# Patient Record
Sex: Male | Born: 2000 | Race: Black or African American | Hispanic: No | Marital: Single | Smoking: Never smoker
Health system: Southern US, Community
[De-identification: ages and names within clinical notes are randomized; demographics above are authoritative.]

## PROBLEM LIST (undated history)

## (undated) DIAGNOSIS — F1010 Alcohol abuse, uncomplicated: Secondary | ICD-10-CM

## (undated) DIAGNOSIS — R569 Unspecified convulsions: Secondary | ICD-10-CM

---
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
## 2019-06-10 ENCOUNTER — Emergency Department (HOSPITAL_COMMUNITY)
Admission: EM | Admit: 2019-06-10 | Discharge: 2019-06-11 | Disposition: A | Discharge status: Home / Self Care | Payer: Self-pay | Attending: Emergency Medicine | Admitting: Emergency Medicine

## 2019-06-10 ENCOUNTER — Emergency Department (HOSPITAL_COMMUNITY): Payer: Self-pay

## 2019-06-10 ENCOUNTER — Other Ambulatory Visit: Payer: Self-pay

## 2019-06-10 DIAGNOSIS — J029 Acute pharyngitis, unspecified: Secondary | ICD-10-CM | POA: Insufficient documentation

## 2019-06-10 DIAGNOSIS — R519 Headache, unspecified: Secondary | ICD-10-CM | POA: Insufficient documentation

## 2019-06-10 DIAGNOSIS — R05 Cough: Secondary | ICD-10-CM | POA: Insufficient documentation

## 2019-06-10 DIAGNOSIS — R112 Nausea with vomiting, unspecified: Secondary | ICD-10-CM | POA: Insufficient documentation

## 2019-06-10 DIAGNOSIS — R059 Cough, unspecified: Secondary | ICD-10-CM

## 2019-06-10 DIAGNOSIS — Z20822 Contact with and (suspected) exposure to covid-19: Secondary | ICD-10-CM | POA: Insufficient documentation

## 2019-06-10 DIAGNOSIS — R569 Unspecified convulsions: Secondary | ICD-10-CM | POA: Insufficient documentation

## 2019-06-10 HISTORY — DX: Unspecified convulsions: R56.9

## 2019-06-10 LAB — CBC WITH DIFFERENTIAL/PLATELET
Abs Immature Granulocytes: 0.08 10*3/uL — ABNORMAL HIGH (ref 0.00–0.07)
Basophils Absolute: 0 10*3/uL (ref 0.0–0.1)
Basophils Relative: 0 %
Eosinophils Absolute: 0 10*3/uL (ref 0.0–0.5)
Eosinophils Relative: 0 %
HCT: 46.1 % (ref 39.0–52.0)
Hemoglobin: 16.3 g/dL (ref 13.0–17.0)
Immature Granulocytes: 1 %
Lymphocytes Relative: 13 %
Lymphs Abs: 2.1 10*3/uL (ref 0.7–4.0)
MCH: 32.3 pg (ref 26.0–34.0)
MCHC: 35.4 g/dL (ref 30.0–36.0)
MCV: 91.3 fL (ref 80.0–100.0)
Monocytes Absolute: 1.3 10*3/uL — ABNORMAL HIGH (ref 0.1–1.0)
Monocytes Relative: 8 %
Neutro Abs: 12.2 10*3/uL — ABNORMAL HIGH (ref 1.7–7.7)
Neutrophils Relative %: 78 %
Platelets: 244 10*3/uL (ref 150–400)
RBC: 5.05 MIL/uL (ref 4.22–5.81)
RDW: 11.8 % (ref 11.5–15.5)
WBC: 15.7 10*3/uL — ABNORMAL HIGH (ref 4.0–10.5)
nRBC: 0 % (ref 0.0–0.2)

## 2019-06-10 LAB — COMPREHENSIVE METABOLIC PANEL
ALT: 31 U/L (ref 0–44)
AST: 58 U/L — ABNORMAL HIGH (ref 15–41)
Albumin: 4.8 g/dL (ref 3.5–5.0)
Alkaline Phosphatase: 127 U/L — ABNORMAL HIGH (ref 38–126)
Anion gap: 27 — ABNORMAL HIGH (ref 5–15)
BUN: 12 mg/dL (ref 6–20)
CO2: 15 mmol/L — ABNORMAL LOW (ref 22–32)
Calcium: 10.4 mg/dL — ABNORMAL HIGH (ref 8.9–10.3)
Chloride: 97 mmol/L — ABNORMAL LOW (ref 98–111)
Creatinine, Ser: 0.87 mg/dL (ref 0.61–1.24)
GFR calc Af Amer: >60 mL/min (ref >60)
GFR calc non Af Amer: >60 mL/min (ref >60)
Glucose, Bld: 151 mg/dL — ABNORMAL HIGH (ref 70–99)
Potassium: 3.6 mmol/L (ref 3.5–5.1)
Sodium: 139 mmol/L (ref 135–145)
Total Bilirubin: 1.7 mg/dL — ABNORMAL HIGH (ref 0.3–1.2)
Total Protein: 9.5 g/dL — ABNORMAL HIGH (ref 6.5–8.1)

## 2019-06-10 LAB — POC SARS CORONAVIRUS 2 AG -  ED: SARS Coronavirus 2 Ag: NEGATIVE

## 2019-06-10 LAB — ETHANOL: Alcohol, Ethyl (B): <10 mg/dL (ref <10)

## 2019-06-10 LAB — LIPASE, BLOOD: Lipase: 21 U/L (ref 11–51)

## 2019-06-10 LAB — CBG MONITORING, ED: Glucose-Capillary: 134 mg/dL — ABNORMAL HIGH (ref 70–99)

## 2019-06-10 LAB — ACETAMINOPHEN LEVEL: Acetaminophen (Tylenol), Serum: <10 ug/mL — ABNORMAL LOW (ref 10–30)

## 2019-06-10 LAB — MAGNESIUM: Magnesium: 2.5 mg/dL — ABNORMAL HIGH (ref 1.7–2.4)

## 2019-06-10 LAB — SALICYLATE LEVEL: Salicylate Lvl: <7 mg/dL — ABNORMAL LOW (ref 7.0–30.0)

## 2019-06-10 MED ORDER — KETOROLAC TROMETHAMINE 15 MG/ML IJ SOLN
15.0000 mg | Freq: Once | INTRAMUSCULAR | Status: DC
  Filled 2019-06-10: qty 1

## 2019-06-10 MED ORDER — BENZONATATE 100 MG PO CAPS: 100.0000 mg | ORAL_CAPSULE | Freq: Three times a day (TID) | ORAL | 0 refills | Status: DC

## 2019-06-10 MED ORDER — LEVETIRACETAM IN NACL 1500 MG/100ML IV SOLN
1500.0000 mg | Freq: Once | INTRAVENOUS | Status: AC
  Administered 2019-06-10: 1500 mg via INTRAVENOUS
  Filled 2019-06-10: qty 100

## 2019-06-10 MED ORDER — LEVETIRACETAM 500 MG PO TABS: 500.0000 mg | ORAL_TABLET | Freq: Two times a day (BID) | ORAL | 0 refills | Status: DC

## 2019-06-10 MED ORDER — MIDAZOLAM HCL 2 MG/2ML IJ SOLN
INTRAMUSCULAR | Status: AC
  Administered 2019-06-10: 21:00:00 2 mg
  Filled 2019-06-10: qty 2

## 2019-06-10 MED ORDER — FLUTICASONE PROPIONATE 50 MCG/ACT NA SUSP: 1.0000 | Freq: Every day | NASAL | 0 refills | Status: DC

## 2019-06-10 MED ORDER — SODIUM CHLORIDE 0.9 % IV BOLUS
1000.0000 mL | Freq: Once | INTRAVENOUS | Status: AC
  Administered 2019-06-10: 21:00:00 1000 mL via INTRAVENOUS

## 2019-06-10 MED ORDER — SODIUM CHLORIDE 0.9 % IV BOLUS
1000.0000 mL | Freq: Once | INTRAVENOUS | Status: AC
  Administered 2019-06-10: 1000 mL via INTRAVENOUS

## 2019-06-10 MED ORDER — ONDANSETRON HCL 4 MG/2ML IJ SOLN
4.0000 mg | Freq: Once | INTRAMUSCULAR | Status: AC
  Administered 2019-06-10: 21:00:00 4 mg via INTRAVENOUS
  Filled 2019-06-10: qty 2

## 2019-06-10 MED ORDER — ONDANSETRON HCL 4 MG PO TABS: 4.0000 mg | ORAL_TABLET | Freq: Three times a day (TID) | ORAL | 0 refills | Status: DC | PRN

## 2019-06-10 NOTE — ED Triage Notes (Signed)
[facility] transported pt from home to [facility] ED and reports the following:   Pt said he has headaches, runny nose, sore throat, feels like vomiting every time he eats. This has been going on for 2 days. Pt hasn't taken any medication. Denies chest pain, SOB, and temperature.

## 2019-06-10 NOTE — ED Provider Notes (Signed)
Patient care signed out at end of shift by Phia Caccavale, PA-C. Presents with COVID-like symptoms Had seizure here. 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 No recurrent seizure in the department. VSS. He is felt appropriate for discharge per plan of previous treatment team.     Elpidio Anis, PA-C 06/11/19 0141    Terald Sleeper, MD 06/11/19 1218

## 2019-06-10 NOTE — ED Provider Notes (Signed)
[facility]-EMERGENCY DEPT Provider Note   CSN: [id number] Arrival date & time: 06/10/19  1924     History Chief Complaint  Patient presents with  . Headache    Julyan Gales is a 19 y.o. male presetning for evaluation of HA, runny nose, cough, n/v.   Pt states he has not been feeling well for 2 days. He states he has been having a generalized headache, runny nose, occasional epistaxis, cough producing green mucus, nausea, vomiting.  He has not been able to tolerate solid foods for the past 2 days.  He has been able to drink water.  He has associated sore throat.  He has not taken anything for his symptoms including Tylenol or ibuprofen.  He denies fevers, chills, ear pain, chest pain, shortness of breath, abdominal pain, urinary symptoms, abnormal bowel movements.  He states he has no medical problems, takes no medications daily.  He denies sick contacts.  He denies contact with known COVID-19 positive person.  History obtained with interpreter service, as pt speaks Swahili.   HPI     No past medical history on file.  There are no problems to display for this patient.   No past surgical history on file.     No family history on file.  Social History   Tobacco Use  . Smoking status: Never Smoker  . Smokeless tobacco: Never Used  Substance Use Topics  . Alcohol use: Never  . Drug use: Never    Home Medications Prior to Admission medications   Medication Sig Start Date End Date Taking? Authorizing Provider  amoxicillin (AMOXIL) 500 MG capsule Take 2 capsules (1,000 mg total) by mouth 2 (two) times daily. 07/02/17   [facility], Chase Picket, PA-C  azithromycin (ZITHROMAX) 250 MG tablet Take 1 tablet (250 mg total) by mouth daily. Take first 2 tablets together, then 1 every day until finished. 07/02/17   [facility], Chase Picket, PA-C  ibuprofen (ADVIL,MOTRIN) 800 MG tablet Take 1 tablet (800 mg total) by mouth every 8 (eight) hours as needed for fever or moderate  pain. 07/02/17   [facility], Chase Picket, PA-C    Allergies    Patient has no known allergies.  Review of Systems   Review of Systems  HENT: Positive for sore throat.   Respiratory: Positive for cough.   Gastrointestinal: Positive for nausea and vomiting.  Neurological: Positive for headaches.  All other systems reviewed and are negative.   Physical Exam Updated Vital Signs BP (!) 145/84   Pulse (!) 107   Temp 98.3 F (36.8 C) (Oral)   Resp 19   SpO2 96%   Physical Exam Vitals and nursing note reviewed.  Constitutional:      General: He is not in acute distress.    Appearance: He is well-developed.     Comments: Appears dehydrated, otherwise nontoxic  HENT:     Head: Normocephalic and atraumatic.     Mouth/Throat:     Mouth: Mucous membranes are dry.  Eyes:     Conjunctiva/sclera: Conjunctivae normal.     Pupils: Pupils are equal, round, and reactive to light.  Cardiovascular:     Rate and Rhythm: Normal rate and regular rhythm.     Pulses: Normal pulses.     Comments: Heart rate around 100 on my exam Pulmonary:     Effort: Pulmonary effort is normal. No respiratory distress.     Breath sounds: Normal breath sounds. No wheezing.     Comments: Clear lung sounds in  all fields.  Intermittent cough noted during exam.  No signs of respiratory distress.  Sats stable on room air. Abdominal:     General: There is no distension.     Palpations: Abdomen is soft. There is no mass.     Tenderness: There is no abdominal tenderness. There is no guarding or rebound.     Comments: No tenderness to palpation the abdomen.  Soft without rigidity, guarding, distention.  Negative rebound.  No peritonitis.  Musculoskeletal:        General: Normal range of motion.     Cervical back: Normal range of motion and neck supple.     Right lower leg: No edema.     Left lower leg: No edema.  Skin:    General: Skin is warm and dry.     Capillary Refill: Capillary refill takes less than 2 seconds.    Neurological:     Mental Status: He is alert and oriented to person, place, and time.  Psychiatric:     Comments: Pt not making eye contact     ED Results / Procedures / Treatments   Labs (all labs ordered are listed, but only abnormal results are displayed) Labs Reviewed  CBC WITH DIFFERENTIAL/PLATELET - Abnormal; Notable for the following components:      Result Value   WBC 15.7 (*)    Neutro Abs 12.2 (*)    Monocytes Absolute 1.3 (*)    Abs Immature Granulocytes 0.08 (*)    All other components within normal limits  CBG MONITORING, ED - Abnormal; Notable for the following components:   Glucose-Capillary 134 (*)    All other components within normal limits  SARS CORONAVIRUS 2 (TAT 6-24 HRS)  COMPREHENSIVE METABOLIC PANEL  LIPASE, BLOOD  RAPID URINE DRUG SCREEN, HOSP PERFORMED  URINALYSIS, ROUTINE W REFLEX MICROSCOPIC  ETHANOL  SALICYLATE LEVEL  ACETAMINOPHEN LEVEL  MAGNESIUM    EKG None  Radiology DG Chest Portable 1 View  Result Date: 06/10/2019 CLINICAL DATA:  Cough. Sore throat and headache. EXAM: PORTABLE CHEST 1 VIEW COMPARISON:  Radiograph 07/02/2017 FINDINGS: The cardiomediastinal contours are normal. The lungs are clear. Pulmonary vasculature is normal. No consolidation, pleural effusion, or pneumothorax. No acute osseous abnormalities are seen. IMPRESSION: Negative AP view of the chest. Electronically Signed   By: Narda Rutherford M.D.   On: 06/10/2019 20:03    Procedures Procedures (including critical care time)  Medications Ordered in ED Medications  ketorolac (TORADOL) 15 MG/ML injection 15 mg (15 mg Intravenous Not Given 06/10/19 2103)  sodium chloride 0.9 % bolus 1,000 mL (1,000 mLs Intravenous New Bag/Given 06/10/19 2102)  ondansetron (ZOFRAN) injection 4 mg (4 mg Intravenous Given 06/10/19 2103)  midazolam (VERSED) 2 MG/2ML injection (2 mg  Given 06/10/19 2030)    ED Course  I have reviewed the triage vital signs and the nursing notes.  Pertinent  labs & imaging results that were available during my care of the patient were reviewed by me and considered in my medical decision making (see chart for details).    MDM Rules/Calculators/A&P                      Pt presenting for evaluation of viral-like illness with symptoms including headache, sore throat, nausea, vomiting, cough.  Consider Covid.  Respiratory exam is reassuring.  He is mildly tachycardic, and appears dehydrated.  As such, will obtain labs to assess for dehydration, assess kidney function, ensure no concerning signs or intra-abdominal abnormality although  patient has no pain.  Will obtain x-ray to ensure no pneumonia.  Informed by RN that patient had a seizure.  I evaluated patient, patient with tonic-clonic movement and clear postictal phase.  He was found on the floor, not in the bed, unknown if he fell.  5 mg of Versed was given.  Patient had epistaxis, which he had reported over the past several days.  Unknown if this is from the fall.  Will obtain further labs including electrolytes, UDS, Tylenol and acetaminophen levels.  Will obtain UDS.  Will obtain CT head, neck, maxillofacial.  Patient has some swelling of his right zygoma, and with epistaxis, concern for possible facial injury. Case discussed with attending, Dr. Langston Masker evaluated the pt.   Patient more awake on my reassessment.  When asked, patient states he has had seizures before, last one was 2 months ago.  He states he is not on any medicine for this, has never been evaluated for seizures.  Labs interpreted by me.  Patient is acidotic with a bicarb of 15 and gap of 27.  This is likely due to the seizure, compounded by dehydration from nausea and vomiting.  White count mildly elevated at 15.  Patient remains afebrile.  Giving fluids for rehydration, Zofran for nausea.  Chest x-ray viewed interpreted by me, no pneumonia, torso effusion, cardiomegaly.  No fracture or dislocation.  EKG shows sinus tach.  CT c-spine, and  maxillofacial negative for acute fracture or bleed.  Patient does have sinusitis.  CT head does not show any fracture or bleed.  There is mild cerebellar tonsillar depression which may be normal or may be a Chiari I malformation.  As patient reports multiple previous seizures, and had a witnessed seizure today with an abnormal head CT, will consult with neurology.  Discussed with Dr. Malen Gauze from neurology who recommends 1500 mg Keppra loading dose in the ED.  Recommends patient be discharged on 500 mg of Keppra twice daily with outpatient neurology follow-up.  Agrees that MRI can be performed nonemergent/outpatient.  Discussed findings and plan with pt, who is agreeable. Discussed CT results and importance of neuro f/u outpatient. Discussed seizure precautions. Pt is tolerating PO at this time, nausea is improved.   Pt signed out to Sharlene Motts, PA-C for f/u on repeat BMP.   Final Clinical Impression(s) / ED Diagnoses Final diagnoses:  None    Rx / DC Orders ED Discharge Orders    None       Lezly Rumpf, PA-C 06/11/19 0000    Wyvonnia Dusky, MD 06/11/19 1220

## 2019-06-10 NOTE — Discharge Instructions (Signed)
Take Keppra twice a day. Use Zofran as needed for nausea or vomiting. Use Tessalon as needed for cough. Use Flonase daily to help with nasal congestion and cough. Use Tylenol and ibuprofen as needed for headache or body aches. Make sure stay well-hydrated water. Call the neurology office listed below to follow-up for further evaluation of seizures.  It is very important that you follow-up with them so you can keep on getting your medication. Do not drive, swim, operate heavy machinery, or perform any tasks that may harm yourself or others if you have another seizure. Return to the emergency room if any new, worsening, concerning symptoms.

## 2019-06-10 NOTE — ED Notes (Signed)
Pt speaks Swahili. Translation iPad is not working. Pt reports sore throat, blood in his nose, headaches, cough and vomits when he eats. Started 2 days ago.

## 2019-06-11 ENCOUNTER — Encounter (HOSPITAL_COMMUNITY): Payer: Self-pay | Admitting: Emergency Medicine

## 2019-06-11 LAB — BASIC METABOLIC PANEL
Anion gap: 10 (ref 5–15)
BUN: 9 mg/dL (ref 6–20)
CO2: 25 mmol/L (ref 22–32)
Calcium: 8.2 mg/dL — ABNORMAL LOW (ref 8.9–10.3)
Chloride: 102 mmol/L (ref 98–111)
Creatinine, Ser: 0.74 mg/dL (ref 0.61–1.24)
GFR calc Af Amer: >60 mL/min (ref >60)
GFR calc non Af Amer: >60 mL/min (ref >60)
Glucose, Bld: 106 mg/dL — ABNORMAL HIGH (ref 70–99)
Potassium: 4.2 mmol/L (ref 3.5–5.1)
Sodium: 137 mmol/L (ref 135–145)

## 2019-06-11 LAB — URINALYSIS, ROUTINE W REFLEX MICROSCOPIC
Bacteria, UA: NONE SEEN
Bilirubin Urine: NEGATIVE
Glucose, UA: NEGATIVE mg/dL
Ketones, ur: 20 mg/dL — AB
Leukocytes,Ua: NEGATIVE
Nitrite: NEGATIVE
Protein, ur: NEGATIVE mg/dL
Specific Gravity, Urine: 1.014 (ref 1.005–1.030)
pH: 6 (ref 5.0–8.0)

## 2019-06-11 LAB — RAPID URINE DRUG SCREEN, HOSP PERFORMED
Amphetamines: NOT DETECTED
Barbiturates: NOT DETECTED
Benzodiazepines: POSITIVE — AB
Cocaine: NOT DETECTED
Opiates: NOT DETECTED
Tetrahydrocannabinol: NOT DETECTED

## 2019-06-11 LAB — SARS CORONAVIRUS 2 (TAT 6-24 HRS): SARS Coronavirus 2: NEGATIVE

## 2019-06-11 NOTE — ED Notes (Addendum)
While connected to the BP cuff and O2 sat, pt stepped out of the room and pointed at his foot. I asked him to return to his room because he was on COVID precautions. His expression was blank, he did not speak, and continued to point at this foot. I asked him repeatedly to step back in the room and close the door. He did not respond. I guided him back in the room and shut the door. I walked to the providers' room and let the provider know the pt was acting abnormally. I walked back to the pt's room. The xray technician had just entered the room and said the pt was having a seizure. Upon entering the room, the pt was lying on the floor having a seizure. It lasted around 30 seconds. Immeditaley, I let the provider know.

## 2019-06-11 NOTE — ED Notes (Signed)
Used provider phone to call interpreter service. Spoke with interpreter [phone]. Explained discharge instructions to pt. Stressed the importance of following up with neurology and taking Keppra. Pt said he is unable to afford all the medications. I told him he had to take Keppra for seizures. I gave him a GoodRx coupon and wrote down prices of Keppra at [employer] [facility] [employer]. The pt said he does not have a ride home. The charge nurse unsuccessully attempted to contact his family, so she called the [facility] for a taxi voucher. Waiting to hear back on taxi voucher.

## 2019-11-02 ENCOUNTER — Emergency Department (HOSPITAL_COMMUNITY): Payer: Self-pay

## 2019-11-02 ENCOUNTER — Emergency Department (HOSPITAL_COMMUNITY)
Admission: EM | Admit: 2019-11-02 | Discharge: 2019-11-02 | Disposition: A | Discharge status: Home / Self Care | Payer: Self-pay | Attending: Emergency Medicine | Admitting: Emergency Medicine

## 2019-11-02 ENCOUNTER — Encounter (HOSPITAL_COMMUNITY): Payer: Self-pay | Admitting: Emergency Medicine

## 2019-11-02 ENCOUNTER — Other Ambulatory Visit: Payer: Self-pay

## 2019-11-02 DIAGNOSIS — J3489 Other specified disorders of nose and nasal sinuses: Secondary | ICD-10-CM | POA: Insufficient documentation

## 2019-11-02 DIAGNOSIS — Z20822 Contact with and (suspected) exposure to covid-19: Secondary | ICD-10-CM | POA: Insufficient documentation

## 2019-11-02 DIAGNOSIS — Z5900 Homelessness unspecified: Secondary | ICD-10-CM

## 2019-11-02 DIAGNOSIS — J019 Acute sinusitis, unspecified: Secondary | ICD-10-CM | POA: Insufficient documentation

## 2019-11-02 LAB — SARS CORONAVIRUS 2 BY RT PCR (HOSPITAL ORDER, PERFORMED IN ~~LOC~~ HOSPITAL LAB): SARS Coronavirus 2: NEGATIVE

## 2019-11-02 MED ORDER — ACETAMINOPHEN 325 MG PO TABS
650.0000 mg | ORAL_TABLET | Freq: Once | ORAL | Status: AC
  Administered 2019-11-02: 650 mg via ORAL
  Filled 2019-11-02: qty 2

## 2019-11-02 MED ORDER — AMOXICILLIN-POT CLAVULANATE 875-125 MG PO TABS
1.0000 | ORAL_TABLET | Freq: Once | ORAL | Status: AC
  Administered 2019-11-02: 1 via ORAL
  Filled 2019-11-02: qty 1

## 2019-11-02 MED ORDER — AMOXICILLIN-POT CLAVULANATE 875-125 MG PO TABS: 1.0000 | ORAL_TABLET | Freq: Two times a day (BID) | ORAL | 0 refills | Status: DC

## 2019-11-02 MED ORDER — FLUTICASONE PROPIONATE 50 MCG/ACT NA SUSP
2.0000 | Freq: Every day | NASAL | Status: DC
  Administered 2019-11-02: 2 via NASAL
  Filled 2019-11-02: qty 16

## 2019-11-02 NOTE — Discharge Instructions (Addendum)
Homeless Resources [place] [employer] [facility] [phone] Call for shelter availability.  You must have access to a phone so they can call you back.  [facility]) [address] [place] [phone] M-F 8:00-3:00; S-S 8:00-2:00  Area Shel[facility] (Men and Women) [address] [phone]  [facility] (Men and Women) [address] [place] [phone]  [facility] [facility] (Domestic Violence Shelter) [address] [place] [phone]  [facility] (Men) [address] [employer] [phone]  [employer] [facility] (Single women and women with children) [address] [place] [phone]     Additionally, due to your symptoms today I have prescribed an antibiotic medication and a nasal spray. Please take the medications as directed.   I have also placed information for a free health clinic in your paperwork so that you can get established with a primary care doctor.  Please call the office to schedule an appointment for follow-up.  If you have any new or worsening symptoms at any time please return to the emergency department for reevaluation -------------------------------------------------------------------------------------------   Rasilimali zisizo na Ny[place]a [place] ya [address] [place] wa Cristina Gong [phone] Piga simu kwa upatikanaji wa Cristina Gong. Lazima uwe na ufikiaji wa simu ili waweze kukupigia tena.  Kituo cha Mchana Kituo cha Rasilimali cha [facility]) Mtaa wa [address] [place] [phone] M-F 8: 00-3: 00; S-8: 00-2: 00  Makao ya eneo [place] za Odem Mjini [place] na [place]) [address] [name] [phone]  Kituo cha Gearldine [place] la [place] la [place] [facility] na [place]) Mtaa wa [address]  mwa [place] [phone]  [facility] ya [place] za [place]) Mtaa wa [address] [place] [phone]  Mawaziri Scharlene Corn wa [place]) Karne ya [address] [place] [phone]  Gearldine [place] la [place] [facility] wasioolewa na [place] walio na watoto) Mtaa wa [address] [place] [phone]     Alice Reichert, kwa sababu ya dalili zako leo nimekuandikia dawa ya antibiotic na dawa ya pua.  Tumia pumzi mbili za dawa ya pua katika kila pua mara moja kwa siku. Tafadhali chukua dawa kama ilivyoelekezwa.  Nimeweka habari pia kwa kliniki ya afya ya bure kwenye makaratasi yako ili uweze kupata nguvu na daktari wa huduma ya msingi. Tafadhali piga simu ofisini kupanga ratiba ya ufuatiliaji. Barnett Hatter dalili mpya au mbaya wakati wowote tafadhali rudi kwa idara ya dharura kwa uchunguzi upya       .

## 2019-11-02 NOTE — Discharge Planning (Signed)
RNCM consulted regarding homelessness resources.  RNCM placed names and numbers of shelters and resources on pt After Visit Summary.  RNCM obtained permission to enter pt in [facility] for additional support.
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
## 2019-11-02 NOTE — ED Notes (Signed)
Patient verbalizes understanding of discharge instructions. Opportunity for questioning and answers were provided. Armband removed by staff, pt discharged from ED ambulatory.   

## 2019-11-02 NOTE — Progress Notes (Signed)
Responded to referral note to support patient and staff. Per nurse patient has cold  Symptom that merit caution until test come back.  Chaplain available as needed.  Venida Jarvis, [place], [facility], Pager [phone]
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
## 2019-11-05 ENCOUNTER — Emergency Department (HOSPITAL_COMMUNITY)
Admission: EM | Admit: 2019-11-05 | Discharge: 2019-11-06 | Disposition: A | Discharge status: Home / Self Care | Payer: Medicaid Other | Attending: Emergency Medicine | Admitting: Emergency Medicine

## 2019-11-05 DIAGNOSIS — Y9389 Activity, other specified: Secondary | ICD-10-CM | POA: Diagnosis not present

## 2019-11-05 DIAGNOSIS — Y998 Other external cause status: Secondary | ICD-10-CM | POA: Diagnosis not present

## 2019-11-05 DIAGNOSIS — Y9289 Other specified places as the place of occurrence of the external cause: Secondary | ICD-10-CM | POA: Diagnosis not present

## 2019-11-05 DIAGNOSIS — S0083XA Contusion of other part of head, initial encounter: Secondary | ICD-10-CM | POA: Insufficient documentation

## 2019-11-05 DIAGNOSIS — T148XXA Other injury of unspecified body region, initial encounter: Secondary | ICD-10-CM

## 2019-11-06 ENCOUNTER — Encounter (HOSPITAL_COMMUNITY): Payer: Self-pay | Admitting: Emergency Medicine

## 2019-11-06 ENCOUNTER — Other Ambulatory Visit: Payer: Self-pay
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
## 2019-12-01 ENCOUNTER — Emergency Department (HOSPITAL_COMMUNITY)
Admission: EM | Admit: 2019-12-01 | Discharge: 2019-12-02 | Disposition: A | Discharge status: Home / Self Care | Payer: Medicaid Other | Attending: Emergency Medicine | Admitting: Emergency Medicine

## 2019-12-01 ENCOUNTER — Other Ambulatory Visit: Payer: Self-pay

## 2019-12-01 ENCOUNTER — Encounter (HOSPITAL_COMMUNITY): Payer: Self-pay | Admitting: *Deleted

## 2019-12-01 DIAGNOSIS — R569 Unspecified convulsions: Secondary | ICD-10-CM

## 2019-12-01 DIAGNOSIS — G40909 Epilepsy, unspecified, not intractable, without status epilepticus: Secondary | ICD-10-CM | POA: Diagnosis present

## 2019-12-01 LAB — BASIC METABOLIC PANEL
Anion gap: 14 (ref 5–15)
BUN: 7 mg/dL (ref 6–20)
CO2: 24 mmol/L (ref 22–32)
Calcium: 9.7 mg/dL (ref 8.9–10.3)
Chloride: 93 mmol/L — ABNORMAL LOW (ref 98–111)
Creatinine, Ser: 0.84 mg/dL (ref 0.61–1.24)
GFR calc Af Amer: >60 mL/min (ref >60)
GFR calc non Af Amer: >60 mL/min (ref >60)
Glucose, Bld: 85 mg/dL (ref 70–99)
Potassium: 4.3 mmol/L (ref 3.5–5.1)
Sodium: 131 mmol/L — ABNORMAL LOW (ref 135–145)

## 2019-12-01 LAB — CBC
HCT: 42.3 % (ref 39.0–52.0)
Hemoglobin: 15.3 g/dL (ref 13.0–17.0)
MCH: 32.1 pg (ref 26.0–34.0)
MCHC: 36.2 g/dL — ABNORMAL HIGH (ref 30.0–36.0)
MCV: 88.9 fL (ref 80.0–100.0)
Platelets: 154 10*3/uL (ref 150–400)
RBC: 4.76 MIL/uL (ref 4.22–5.81)
RDW: 12.2 % (ref 11.5–15.5)
WBC: 6 10*3/uL (ref 4.0–10.5)
nRBC: 0 % (ref 0.0–0.2)
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
## 2019-12-01 NOTE — ED Notes (Addendum)
Sean [name]- casemanager [phone] please call if any information is needed

## 2019-12-02 ENCOUNTER — Emergency Department (HOSPITAL_COMMUNITY): Payer: Medicaid Other

## 2019-12-02 MED ORDER — LEVETIRACETAM 500 MG PO TABS
1000.0000 mg | ORAL_TABLET | Freq: Once | ORAL | Status: AC
  Administered 2019-12-02: 1000 mg via ORAL
  Filled 2019-12-02: qty 2

## 2019-12-02 MED ORDER — LEVETIRACETAM 500 MG PO TABS: 500.0000 mg | ORAL_TABLET | Freq: Two times a day (BID) | ORAL | 0 refills | Status: DC

## 2019-12-02 MED ORDER — ACETAMINOPHEN 500 MG PO TABS
1000.0000 mg | ORAL_TABLET | Freq: Once | ORAL | Status: AC
  Administered 2019-12-02: 1000 mg via ORAL
  Filled 2019-12-02: qty 2
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
## 2019-12-02 NOTE — ED Notes (Signed)
ED Provider at bedside. 

## 2019-12-05 ENCOUNTER — Encounter (HOSPITAL_COMMUNITY): Payer: Self-pay | Admitting: *Deleted

## 2020-02-20 ENCOUNTER — Ambulatory Visit: Payer: Medicaid Other | Admitting: Diagnostic Neuroimaging

## 2020-08-05 ENCOUNTER — Ambulatory Visit: Payer: Medicaid Other | Admitting: Nurse Practitioner

## 2020-08-19 ENCOUNTER — Ambulatory Visit: Payer: Medicaid Other | Admitting: Nurse Practitioner

## 2020-09-02 ENCOUNTER — Ambulatory Visit: Payer: Self-pay | Admitting: Nurse Practitioner

## 2020-09-16 ENCOUNTER — Ambulatory Visit: Payer: Self-pay | Admitting: Nurse Practitioner

## 2020-09-25 ENCOUNTER — Ambulatory Visit: Payer: Self-pay | Admitting: Nurse Practitioner

## 2020-09-27 ENCOUNTER — Ambulatory Visit (HOSPITAL_COMMUNITY)
Admission: RE | Admit: 2020-09-27 | Discharge: 2020-09-27 | Disposition: A | Discharge status: Home / Self Care | Payer: Self-pay | Attending: Psychiatry | Admitting: Psychiatry

## 2020-09-27 NOTE — BH Assessment (Addendum)
Sean [name] is a 20 year old male that presents this date requesting assistance for ongoing alcohol use and also reports a history of seizures with last reported seizure one month ago. Patient native language is Swahili and [name] was used to translate. Patient reports ongoing alcohol use stating that he drinks "five bottles of alcohol" on weekends although is vague in reference to exactly what type of alcohol he is consuming or last use rendering a conflicting history stating he consumed "some beers" 48 hours ago although denied later in the interview stating his last use was last weekend. Patient denies any other drug use and does not appear to be impaired this date. Patient denies any S/I, H/I or AVH. Patient denies any prior attempts or gestures to self harm or has a past mental health diagnosis. Patient denies any mental health symptoms this date.   Per chart review patient was seen on 12/01/19 when he presented with seizure like activity and was seen by Mesner MD at [facility] who wrote on that date:  From triage nursing note: Pt BIB [facility] for seizure, pt denies hx of seizure, family reports there is a hx of seizure.Witnessed while in a class room, bit his tongue, urinary incontinence, unsure if he had total body convulsions. Alert and oriented to person only and postictal on EMS arrival.  Patient appears to be confused this date in reference to services that are offered here at [facility]. This Clinical research associate clarified and will assist in providing OP resources to area providers in reference to neurology and local SA providers to assist with ongoing alcohol use. Case was staffed with Lucianne Muss MD who recommended patient be discharged with resources. Patient was also provided with [facility] information and walk in times. Interpretor was utilized and patient voiced understanding of discharge stating he will follow up with resources provided.   Patient is oriented x 5 and pt resents with appropriate affect. Patient's memory appears to  be intact with thoughts organized. Patient does not appear to be responding to internal stimuli. Patient's mental health history is limited per chart review. As noted patient will be provided with resources.

## 2020-09-27 NOTE — BH Assessment (Signed)
Medical Screening Exam    History of the Present Illness Sean [name] is a 47 YOM with a PMHx of seizures, presenting as a walk in to [facility]. Per chart review, patient had a seizure in September of 2021 and was brought to [facility]. He was discharged on Keppra 500 bid with neuro follow up (which appears to have never occurred). He reports last having a seizure one month ago. Patient also reports drinking several bottles of various liquor two times per week. Patient reports his last drink was on Wednesday. He denies other drug use.    Psychiatric Specialty Exam: Physical Exam Constitutional:      Appearance: Normal appearance.  HENT:     Head: Normocephalic and atraumatic.  Eyes:     General: No scleral icterus.    Extraocular Movements: Extraocular movements intact.     Pupils: Pupils are equal, round, and reactive to light.  Cardiovascular:     Rate and Rhythm: Normal rate and regular rhythm.     Pulses: Normal pulses.     Heart sounds: Normal heart sounds.  Pulmonary:     Effort: Pulmonary effort is normal.     Breath sounds: Normal breath sounds.  Abdominal:     General: Abdomen is flat. Bowel sounds are normal. There is no distension.     Palpations: Abdomen is soft.     Tenderness: There is no abdominal tenderness. There is no guarding.  Musculoskeletal:        General: Normal range of motion.  Skin:    General: Skin is warm and dry.  Neurological:     General: No focal deficit present.     Mental Status: He is alert and oriented to person, place, and time.    Review of Systems: negative for CP, SoB, Cough, ABD pain, vision changes, numbness, tremor. Positive for headache (consistent for two weeks)  Blood pressure 115/74, pulse 85, temperature 98.2 F (36.8 C), temperature source Oral, resp. rate 18.There is no height or weight on file to calculate BMI.  General Appearance: Fairly Groomed  Eye Contact:  Fair  Speech:  Clear and Coherent and Slow  Volume:  Normal  Mood:   Euthymic  Affect:  Congruent  Thought Process:  Coherent and Goal Directed  Orientation:  Full (Time, Place, and Person)  Thought Content:  Logical  Suicidal Thoughts:  No  Homicidal Thoughts:  No  Memory:   grossly intact per interview  Judgement:  Poor  Insight:  Lacking and Present  Psychomotor Activity:  Normal  Concentration:  Concentration: Good  Recall:  Good  Fund of Knowledge:  Fair  Language:  Good  Akathisia:  Negative  Handed:  unknown  AIMS (if indicated):   not indicated  Assets:  Communication Skills  ADL's:  Intact  Cognition:  WNL  Sleep:   unknown    Medical Decision Making Based on my medical examination, this patient does not appear to have a medical emergency and can be discharged with outpatient services.   Plan Patient will be given information and resources for rehab as well as Neuro follow up and follow up at [facility].  Case discussed with Dr. Nelly Rout.

## 2020-09-30 ENCOUNTER — Emergency Department (HOSPITAL_COMMUNITY)
Admission: EM | Admit: 2020-09-30 | Discharge: 2020-09-30 | Disposition: A | Discharge status: Home / Self Care | Payer: Medicaid Other | Attending: Emergency Medicine | Admitting: Emergency Medicine

## 2020-09-30 ENCOUNTER — Emergency Department (HOSPITAL_COMMUNITY)
Admission: EM | Admit: 2020-09-30 | Discharge: 2020-09-30 | Disposition: A | Discharge status: Home / Self Care | Payer: Medicaid Other | Source: Home / Self Care

## 2020-09-30 ENCOUNTER — Encounter (HOSPITAL_COMMUNITY): Payer: Self-pay

## 2020-09-30 ENCOUNTER — Other Ambulatory Visit: Payer: Self-pay

## 2020-09-30 ENCOUNTER — Encounter (HOSPITAL_COMMUNITY): Payer: Self-pay | Admitting: Emergency Medicine

## 2020-09-30 DIAGNOSIS — R569 Unspecified convulsions: Secondary | ICD-10-CM | POA: Insufficient documentation

## 2020-09-30 DIAGNOSIS — Z5321 Procedure and treatment not carried out due to patient leaving prior to being seen by health care provider: Secondary | ICD-10-CM | POA: Insufficient documentation

## 2020-09-30 DIAGNOSIS — F10129 Alcohol abuse with intoxication, unspecified: Secondary | ICD-10-CM | POA: Insufficient documentation

## 2020-09-30 DIAGNOSIS — Z760 Encounter for issue of repeat prescription: Secondary | ICD-10-CM | POA: Insufficient documentation

## 2020-09-30 LAB — CBC WITH DIFFERENTIAL/PLATELET
Abs Immature Granulocytes: 0.01 10*3/uL (ref 0.00–0.07)
Basophils Absolute: 0 10*3/uL (ref 0.0–0.1)
Basophils Relative: 1 %
Eosinophils Absolute: 0.3 10*3/uL (ref 0.0–0.5)
Eosinophils Relative: 6 %
HCT: 43.7 % (ref 39.0–52.0)
Hemoglobin: 16.2 g/dL (ref 13.0–17.0)
Immature Granulocytes: 0 %
Lymphocytes Relative: 54 %
Lymphs Abs: 2.6 10*3/uL (ref 0.7–4.0)
MCH: 32.5 pg (ref 26.0–34.0)
MCHC: 37.1 g/dL — ABNORMAL HIGH (ref 30.0–36.0)
MCV: 87.8 fL (ref 80.0–100.0)
Monocytes Absolute: 0.2 10*3/uL (ref 0.1–1.0)
Monocytes Relative: 4 %
Neutro Abs: 1.6 10*3/uL — ABNORMAL LOW (ref 1.7–7.7)
Neutrophils Relative %: 35 %
Platelets: 228 10*3/uL (ref 150–400)
RBC: 4.98 MIL/uL (ref 4.22–5.81)
RDW: 12.2 % (ref 11.5–15.5)
WBC: 4.7 10*3/uL (ref 4.0–10.5)
nRBC: 0 % (ref 0.0–0.2)

## 2020-09-30 LAB — COMPREHENSIVE METABOLIC PANEL
ALT: 38 U/L (ref 0–44)
AST: 33 U/L (ref 15–41)
Albumin: 4.8 g/dL (ref 3.5–5.0)
Alkaline Phosphatase: 88 U/L (ref 38–126)
Anion gap: 15 (ref 5–15)
BUN: 8 mg/dL (ref 6–20)
CO2: 26 mmol/L (ref 22–32)
Calcium: 9.5 mg/dL (ref 8.9–10.3)
Chloride: 99 mmol/L (ref 98–111)
Creatinine, Ser: 0.64 mg/dL (ref 0.61–1.24)
GFR, Estimated: >60 mL/min (ref >60)
Glucose, Bld: 93 mg/dL (ref 70–99)
Potassium: 4 mmol/L (ref 3.5–5.1)
Sodium: 140 mmol/L (ref 135–145)
Total Bilirubin: 1 mg/dL (ref 0.3–1.2)
Total Protein: 8.5 g/dL — ABNORMAL HIGH (ref 6.5–8.1)

## 2020-09-30 NOTE — ED Notes (Signed)
Pt walked out im not sure if pt is coming back

## 2020-09-30 NOTE — ED Triage Notes (Signed)
Patient arrived by [facility] following reported seizure hours ago. Patient with ETOH on board and alert and oriented. Patient argumentative in triage

## 2020-09-30 NOTE — ED Notes (Addendum)
Called pt X3 and looked and called outside no answer

## 2020-09-30 NOTE — ED Provider Notes (Signed)
Emergency Medicine Provider Triage Evaluation Note  Sean [name] , a 20 y.o. male  was evaluated in triage.  Pt complains of seizures.  Patient reports he has been out of his seizure medication for at least a month, cannot remember the name of it, only medication documented in chart is Keppra.  Reports he had 1 seizure yesterday morning, but has not been able to get into see his doctor to get a refill of his seizure medication.  History is very limited Swahili interpreter used to obtain history.  Review of Systems  Positive: Seizure Negative: Fever, vomiting, numbness, weakness  Physical Exam  BP 136/89 (BP Location: Right Arm)   Pulse 92   Temp 97.7 F (36.5 C)   Resp 14   SpO2 100%  Gen:   Awake, no distress   Resp:  Normal effort  MSK:   Moves extremities without difficulty  Other:    Medical Decision Making  Medically screening exam initiated at 1:10 PM.  Appropriate orders placed.  Sean [name] was informed that the remainder of the evaluation will be completed by another provider, this initial triage assessment does not replace that evaluation, and the importance of remaining in the ED until their evaluation is complete.     Sean Lodge, PA-C 09/30/20 1314    Derwood Kaplan, MD 10/01/20 [phone]

## 2020-09-30 NOTE — ED Triage Notes (Signed)
Using interpreter patient states he has epilepsy and is out of his medications for all of June. Reports one seizure yesterday lasting 30 mins.

## 2020-10-14 ENCOUNTER — Other Ambulatory Visit: Payer: Self-pay

## 2020-10-14 ENCOUNTER — Emergency Department (HOSPITAL_COMMUNITY)
Admission: EM | Admit: 2020-10-14 | Discharge: 2020-10-14 | Discharge status: Against Medical Advice | Payer: Medicaid Other | Attending: Emergency Medicine | Admitting: Emergency Medicine

## 2020-10-14 DIAGNOSIS — G40909 Epilepsy, unspecified, not intractable, without status epilepticus: Secondary | ICD-10-CM | POA: Diagnosis not present

## 2020-10-14 DIAGNOSIS — F10129 Alcohol abuse with intoxication, unspecified: Secondary | ICD-10-CM | POA: Diagnosis not present

## 2020-10-14 DIAGNOSIS — R569 Unspecified convulsions: Secondary | ICD-10-CM | POA: Diagnosis present

## 2020-10-14 MED ORDER — LEVETIRACETAM 500 MG PO TABS: 500.0000 mg | ORAL_TABLET | Freq: Two times a day (BID) | ORAL | 1 refills | Status: DC

## 2020-10-14 MED ORDER — LEVETIRACETAM IN NACL 1500 MG/100ML IV SOLN
1500.0000 mg | Freq: Once | INTRAVENOUS | Status: DC
  Filled 2020-10-14: qty 100

## 2020-10-14 NOTE — ED Provider Notes (Signed)
[facility] EMERGENCY DEPARTMENT Provider Note   CSN: [id number] Arrival date & time: 10/14/20  1105     History Chief Complaint  Patient presents with   Seizures   Alcohol Intoxication    Sean [name] is a 20 y.o. male.   Seizures Alcohol Intoxication   Patient presented to the ED for evaluation of seizures.  Patient states he is here because he has not been able to take his seizure medications.  He ran out of them.    Past Medical History:  Diagnosis Date   Seizures (HCC)     There are no problems to display for this patient.   No past surgical history on file.     No family history on file.  Social History   Tobacco Use   Smoking status: Never   Smokeless tobacco: Never  Substance Use Topics   Alcohol use: Never   Drug use: Never    Home Medications Prior to Admission medications   Medication Sig Start Date End Date Taking? Authorizing Provider  levETIRAcetam (KEPPRA) 500 MG tablet Take 1 tablet (500 mg total) by mouth 2 (two) times daily. 10/14/20 11/13/20  Linwood Dibbles, MD  ondansetron (ZOFRAN) 4 MG tablet Take 1 tablet (4 mg total) by mouth every 8 (eight) hours as needed. 06/10/19   Caccavale, Sophia, PA-C  fluticasone (FLONASE) 50 MCG/ACT nasal spray Place 1 spray into both nostrils daily. 06/10/19 12/02/19  Caccavale, Sophia, PA-C    Allergies    Patient has no known allergies.  Review of Systems   Review of Systems  Neurological:  Positive for seizures.  All other systems reviewed and are negative.  Physical Exam Updated Vital Signs BP (!) 136/91   Pulse 100   Temp 97.6 F (36.4 C) (Oral)   Resp 16   SpO2 98%   Physical Exam Vitals and nursing note reviewed.  Constitutional:      General: He is not in acute distress.    Appearance: He is well-developed.  HENT:     Head: Normocephalic and atraumatic.     Right Ear: External ear normal.     Left Ear: External ear normal.  Eyes:     General: No scleral icterus.        Right eye: No discharge.        Left eye: No discharge.     Conjunctiva/sclera: Conjunctivae normal.  Neck:     Trachea: No tracheal deviation.  Cardiovascular:     Rate and Rhythm: Normal rate and regular rhythm.  Pulmonary:     Effort: Pulmonary effort is normal. No respiratory distress.     Breath sounds: Normal breath sounds. No stridor. No wheezing or rales.  Abdominal:     General: Bowel sounds are normal. There is no distension.     Palpations: Abdomen is soft.     Tenderness: There is no abdominal tenderness. There is no guarding or rebound.  Musculoskeletal:        General: No tenderness or deformity.     Cervical back: Neck supple.  Skin:    General: Skin is warm and dry.     Findings: No rash.  Neurological:     General: No focal deficit present.     Mental Status: He is alert.     Cranial Nerves: No cranial nerve deficit (no facial droop, extraocular movements intact, no slurred speech).     Sensory: No sensory deficit.     Motor: No abnormal muscle tone or  seizure activity.     Coordination: Coordination normal.  Psychiatric:        Mood and Affect: Mood normal.    ED Results / Procedures / Treatments   Labs (all labs ordered are listed, but only abnormal results are displayed) Labs Reviewed  CBC  ETHANOL  RAPID URINE DRUG SCREEN, HOSP PERFORMED  COMPREHENSIVE METABOLIC PANEL  CBG MONITORING, ED    EKG None  Radiology No results found.  Procedures Procedures   Medications Ordered in ED Medications  levETIRAcetam (KEPPRA) IVPB 1500 mg/ 100 mL premix (has no administration in time range)    ED Course  I have reviewed the triage vital signs and the nursing notes.  Pertinent labs & imaging results that were available during my care of the patient were reviewed by me and considered in my medical decision making (see chart for details).  Clinical Course as of 10/14/20 1221  Mon Oct 14, 2020  1214 Patient informed me that he does not want to stay  any longer.  He is concerned about being able to get back into his home [JK]  1214 Patient is speaking clearly.  Appears to understand the risks [JK]    Clinical Course User Index [JK] Linwood Dibbles, MD   MDM Rules/Calculators/A&P                          Patient presented to the ED for evaluation of possible seizure and concerns for alcohol intoxication.  Patient has had previous visits to the ED with similar complaints.  History unfortunately somewhat limited as the patient's primary language is Swahili.  I was using the translator but the device malfunctioned.  Patient is able to communicate with me in [place].  He has decided he does not want to stay any longer.  He is concerned about his apartment and needs to check on it.  Patient may be intoxicated but he does appear alert and awake, understands risks and benefits and I do not feel that he warrants involuntary commitment.  We will give prescription for Keppra.  I informed the patient that he can return anytime Final Clinical Impression(s) / ED Diagnoses Final diagnoses:  Seizure disorder [employer])    Rx / DC Orders ED Discharge Orders          Ordered    levETIRAcetam (KEPPRA) 500 MG tablet  2 times daily        10/14/20 1213             Linwood Dibbles, MD 10/14/20 1224

## 2020-10-14 NOTE — ED Notes (Signed)
Pt walked out of his room & refused to stay then began walking out of hospital. This RN tracked him down in the waiting room to give him his D/C papers that had his Keppra refill on it. Since he was still in the system & had not been taken out yet a bus pass was given for his transport home.

## 2020-10-14 NOTE — Discharge Instructions (Addendum)
Take the medications as prescribed.  Return to the ED for change your mind and want to be evaluated further for the seizures

## 2020-10-17 ENCOUNTER — Telehealth: Payer: Self-pay

## 2020-10-18 NOTE — Telephone Encounter (Signed)
noted 

## 2020-11-16 ENCOUNTER — Encounter (HOSPITAL_COMMUNITY): Payer: Self-pay

## 2020-11-16 ENCOUNTER — Emergency Department (HOSPITAL_COMMUNITY)
Admission: EM | Admit: 2020-11-16 | Discharge: 2020-11-16 | Disposition: A | Discharge status: Home / Self Care | Payer: Medicaid Other | Attending: Emergency Medicine | Admitting: Emergency Medicine

## 2020-11-16 ENCOUNTER — Other Ambulatory Visit: Payer: Self-pay

## 2020-11-16 DIAGNOSIS — R569 Unspecified convulsions: Secondary | ICD-10-CM | POA: Diagnosis not present

## 2020-11-16 DIAGNOSIS — S00512A Abrasion of oral cavity, initial encounter: Secondary | ICD-10-CM | POA: Insufficient documentation

## 2020-11-16 DIAGNOSIS — R000 Tachycardia, unspecified: Secondary | ICD-10-CM | POA: Diagnosis not present

## 2020-11-16 DIAGNOSIS — X58XXXA Exposure to other specified factors, initial encounter: Secondary | ICD-10-CM | POA: Insufficient documentation

## 2020-11-16 DIAGNOSIS — S00502A Unspecified superficial injury of oral cavity, initial encounter: Secondary | ICD-10-CM | POA: Diagnosis present

## 2020-11-16 LAB — CBC
HCT: 44.3 % (ref 39.0–52.0)
Hemoglobin: 16 g/dL (ref 13.0–17.0)
MCH: 32.7 pg (ref 26.0–34.0)
MCHC: 36.1 g/dL — ABNORMAL HIGH (ref 30.0–36.0)
MCV: 90.4 fL (ref 80.0–100.0)
Platelets: 133 10*3/uL — ABNORMAL LOW (ref 150–400)
RBC: 4.9 MIL/uL (ref 4.22–5.81)
RDW: 12.7 % (ref 11.5–15.5)
WBC: 6.8 10*3/uL (ref 4.0–10.5)
nRBC: 0 % (ref 0.0–0.2)

## 2020-11-16 LAB — BASIC METABOLIC PANEL
Anion gap: 13 (ref 5–15)
BUN: <5 mg/dL — ABNORMAL LOW (ref 6–20)
CO2: 23 mmol/L (ref 22–32)
Calcium: 9.6 mg/dL (ref 8.9–10.3)
Chloride: 102 mmol/L (ref 98–111)
Creatinine, Ser: 0.57 mg/dL — ABNORMAL LOW (ref 0.61–1.24)
GFR, Estimated: >60 mL/min (ref >60)
Glucose, Bld: 115 mg/dL — ABNORMAL HIGH (ref 70–99)
Potassium: 3.5 mmol/L (ref 3.5–5.1)
Sodium: 138 mmol/L (ref 135–145)

## 2020-11-16 LAB — CBG MONITORING, ED: Glucose-Capillary: 115 mg/dL — ABNORMAL HIGH (ref 70–99)

## 2020-11-16 MED ORDER — SODIUM CHLORIDE 0.9 % IV BOLUS
1000.0000 mL | Freq: Once | INTRAVENOUS | Status: AC
  Administered 2020-11-16: 1000 mL via INTRAVENOUS

## 2020-11-16 MED ORDER — LEVETIRACETAM 500 MG PO TABS: 500.0000 mg | ORAL_TABLET | Freq: Two times a day (BID) | ORAL | 1 refills | Status: DC

## 2020-11-16 MED ORDER — LEVETIRACETAM IN NACL 1500 MG/100ML IV SOLN
1500.0000 mg | Freq: Once | INTRAVENOUS | Status: AC
  Administered 2020-11-16: 1500 mg via INTRAVENOUS
  Filled 2020-11-16: qty 100

## 2020-11-16 NOTE — ED Notes (Signed)
Upon entering the room, the pt was just laying in bed with his IV out and normal saline all over the floor, PA aware and at bedside we will monitor pt a little longer he is still postictal and unsure how he is going to get home or where he is going to go.

## 2020-11-16 NOTE — ED Notes (Signed)
Instructed patient to ambulate in the room, did not c/o any dizziness or headache. Ambulated back and forth around the bed. Patient tolerated well.

## 2020-11-16 NOTE — ED Provider Notes (Signed)
[facility] EMERGENCY DEPARTMENT Provider Note   CSN: [id number] Arrival date & time: 11/16/20  0547     History Chief Complaint  Patient presents with   Seizures    Sean [name] is a 20 y.o. male with a hx of seizures who presents to the ED S/p seizure this AM. Patient reports he had a seizure, he is unsure duration, knows this occurred as he bit his tongue. Tongue feels a bit sore, no alleviating/aggravating factors, and no other current complaints. He states he is not taking seizure medication right now- still needs this. Denies headache, visual disturbance, numbness, weakness, neck pain, N/V/D, chest pain, abdominal pain, or fever.   Interpretor utilized throughout Audiological scientist.   HPI     Past Medical History:  Diagnosis Date   Seizures (HCC)     There are no problems to display for this patient.   History reviewed. No pertinent surgical history.     History reviewed. No pertinent family history.  Social History   Tobacco Use   Smoking status: Never   Smokeless tobacco: Never  Vaping Use   Vaping Use: Never used  Substance Use Topics   Alcohol use: Yes   Drug use: Never    Home Medications Prior to Admission medications   Medication Sig Start Date End Date Taking? Authorizing Provider  levETIRAcetam (KEPPRA) 500 MG tablet Take 1 tablet (500 mg total) by mouth 2 (two) times daily. Patient not taking: Reported on 11/16/2020 10/14/20 11/13/20  Linwood Dibbles, MD  ondansetron (ZOFRAN) 4 MG tablet Take 1 tablet (4 mg total) by mouth every 8 (eight) hours as needed. Patient not taking: Reported on 11/16/2020 06/10/19   Caccavale, Sophia, PA-C  fluticasone (FLONASE) 50 MCG/ACT nasal spray Place 1 spray into both nostrils daily. 06/10/19 12/02/19  Caccavale, Sophia, PA-C    Allergies    Patient has no known allergies.  Review of Systems   Review of Systems  Constitutional:  Negative for chills and fever.  HENT:         Positive for tongue injury.    Respiratory:  Negative for shortness of breath.   Cardiovascular:  Negative for chest pain.  Gastrointestinal:  Negative for abdominal pain, nausea and vomiting.  Neurological:  Positive for seizures. Negative for dizziness, weakness, numbness and headaches.  All other systems reviewed and are negative.  Physical Exam Updated Vital Signs BP 120/85   Pulse 96   Temp (!) 97.4 F (36.3 C) (Oral)   Resp 17   Ht 5\' 2"  (1.575 m)   Wt 63.5 kg   SpO2 99%   BMI 25.61 kg/m   Physical Exam Vitals and nursing note reviewed.  Constitutional:      General: He is not in acute distress.    Appearance: He is well-developed. He is not toxic-appearing.  HENT:     Head: Normocephalic and atraumatic. No raccoon eyes or Battle's sign.     Mouth/Throat:     Pharynx: Oropharynx is clear. Uvula midline.     Comments: Abrasion to left lateral tongue. No active bleeding.   Eyes:     General:        Right eye: No discharge.        Left eye: No discharge.     Extraocular Movements: Extraocular movements intact.     Conjunctiva/sclera: Conjunctivae normal.     Pupils: Pupils are equal, round, and reactive to light.  Cardiovascular:     Rate and Rhythm: Normal rate and regular  rhythm.  Pulmonary:     Effort: Pulmonary effort is normal. No respiratory distress.     Breath sounds: Normal breath sounds. No wheezing, rhonchi or rales.  Abdominal:     General: There is no distension.     Palpations: Abdomen is soft.     Tenderness: There is no abdominal tenderness.  Musculoskeletal:     Cervical back: Normal range of motion and neck supple. No spinous process tenderness.     Comments: Moving all extremities.   Skin:    General: Skin is warm and dry.     Findings: No rash.  Neurological:     Mental Status: He is alert.     Comments: Alert. Clear speech. CN III-XII grossly intact. Sensation grossly intact to bilateral upper/lower extremities. 5/5 symmetric grip strength & strength with  plantar/dorsiflexion bilaterally.   Psychiatric:        Behavior: Behavior normal.    ED Results / Procedures / Treatments   Labs (all labs ordered are listed, but only abnormal results are displayed) Labs Reviewed  CBC - Abnormal; Notable for the following components:      Result Value   MCHC 36.1 (*)    Platelets 133 (*)    All other components within normal limits  CBG MONITORING, ED - Abnormal; Notable for the following components:   Glucose-Capillary 115 (*)    All other components within normal limits  BASIC METABOLIC PANEL    EKG EKG Interpretation  Date/Time:  Saturday November 16 2020 06:04:16 EDT Ventricular Rate:  104 PR Interval:  176 QRS Duration: 99 QT Interval:  338 QTC Calculation: 445 R Axis:   58 Text Interpretation: Sinus tachycardia Early repolarization Confirmed by Geoffery Lyons ([id number]) on 11/16/2020 6:08:58 AM  Radiology No results found.  Procedures Procedures   Medications Ordered in ED Medications  levETIRAcetam (KEPPRA) IVPB 1500 mg/ 100 mL premix (has no administration in time range)  sodium chloride 0.9 % bolus 1,000 mL (has no administration in time range)    ED Course  I have reviewed the triage vital signs and the nursing notes.  Pertinent labs & imaging results that were available during my care of the patient were reviewed by me and considered in my medical decision making (see chart for details).    MDM Rules/Calculators/A&P                           Patient presents to the ED status post seizure activity.  Nontoxic, initial tachycardia resolved on my exam.  Currently patient feels at baseline, mild abrasion to the tongue noted, no focal neurologic deficits or other signs of trauma.  Plan for basic labs, monitoring, and loading with Keppra while in the emergency department.  Additional history obtained:  Additional history obtained from chart review & nursing note review.   Lab Tests:  I Ordered, reviewed, and interpreted  labs, which included:  CBC: Mild thrombocytopenia which will need PCP recheck BMP: No significant electrolyte derangement  ED Course:  Patient remains resting comfortably without complaints, states he is ready to go home, observed in the ED for several hours, ambulatory, tolerating PO.  Given new prescription for Keppra and ambulatory referral sent to neurology.  I discussed results, treatment plan, need for follow-up, and return precautions with the patient. Provided opportunity for questions, patient confirmed understanding and is in agreement with plan.   Portions of this note were generated with Scientist, clinical (histocompatibility and immunogenetics). Dictation errors may  occur despite best attempts at proofreading.  Final Clinical Impression(s) / ED Diagnoses Final diagnoses:  Seizure [facility])    Rx / DC Orders ED Discharge Orders     None        Cherly Anderson, PA-C 11/16/20 1435    Jacalyn Lefevre, MD 11/17/20 [phone]

## 2020-11-16 NOTE — Discharge Instructions (Addendum)
You were seen in the emergency department after seizure.  Your labs show that your platelet count was mildly low, have this rechecked by your primary care provider.  We are sending you home with a prescription for Keppra, please try taking this as prescribed.  We have sent a referral to neurology, please follow-up with neurology within 1 week.  Do not drive or operate heavy machinery until you have been cleared by a neurologist.  Return to the emergency department for any new or worsening symptoms including but not limited to headache, change in vision, numbness, weakness, fever, neck stiffness, vomiting, chest pain, trouble breathing, or any other concerns.

## 2020-11-16 NOTE — ED Triage Notes (Signed)
EMS reports being called out for seizures, state pt met them at the door with his book bag, refused to sit on stretcher, decided en route that he did not want to be seen.  Denied any injury.  On arrival, pt is alert to self, place and time. Pt states he woke up after seizure because he bit his tongue. Pt denies any other injury. Pt states he used to take seizure medication but has not had "in awhile". Pt unable to state which medication he took. Pt states he is concerned about leaving his house and repeatedly asks how long "this will take."  Swahili interpreter used for triage.

## 2020-12-11 ENCOUNTER — Ambulatory Visit: Payer: Medicaid Other | Admitting: Neurology

## 2020-12-11 ENCOUNTER — Other Ambulatory Visit: Payer: Self-pay

## 2020-12-11 ENCOUNTER — Ambulatory Visit (INDEPENDENT_AMBULATORY_CARE_PROVIDER_SITE_OTHER): Payer: Medicaid Other | Admitting: Nurse Practitioner

## 2020-12-11 ENCOUNTER — Encounter: Payer: Self-pay | Admitting: Nurse Practitioner

## 2020-12-11 VITALS — BP 131/82 | HR 90 | Temp 98.1°F | Ht 65.5 in | Wt 134.0 lb

## 2020-12-11 DIAGNOSIS — G40909 Epilepsy, unspecified, not intractable, without status epilepticus: Secondary | ICD-10-CM

## 2020-12-11 DIAGNOSIS — Z131 Encounter for screening for diabetes mellitus: Secondary | ICD-10-CM

## 2020-12-11 DIAGNOSIS — Z0000 Encounter for general adult medical examination without abnormal findings: Secondary | ICD-10-CM | POA: Diagnosis not present

## 2020-12-11 DIAGNOSIS — Z5982 Transportation insecurity: Secondary | ICD-10-CM

## 2020-12-11 DIAGNOSIS — R569 Unspecified convulsions: Secondary | ICD-10-CM

## 2020-12-11 DIAGNOSIS — Z599 Problem related to housing and economic circumstances, unspecified: Secondary | ICD-10-CM

## 2020-12-11 DIAGNOSIS — Z9189 Other specified personal risk factors, not elsewhere classified: Secondary | ICD-10-CM

## 2020-12-11 DIAGNOSIS — F10288 Alcohol dependence with other alcohol-induced disorder: Secondary | ICD-10-CM

## 2020-12-11 LAB — POCT URINALYSIS DIP (CLINITEK)
Bilirubin, UA: NEGATIVE
Blood, UA: NEGATIVE
Glucose, UA: NEGATIVE mg/dL
Ketones, POC UA: NEGATIVE mg/dL
Leukocytes, UA: NEGATIVE
Nitrite, UA: NEGATIVE
POC PROTEIN,UA: NEGATIVE
Spec Grav, UA: <=1.005 — AB (ref 1.010–1.025)
Urobilinogen, UA: 0.2 E.U./dL
pH, UA: 5.5 (ref 5.0–8.0)

## 2020-12-11 LAB — GLUCOSE, POCT (MANUAL RESULT ENTRY): POC Glucose: 69 mg/dl — AB (ref 70–99)

## 2020-12-11 LAB — POCT GLYCOSYLATED HEMOGLOBIN (HGB A1C): Hemoglobin A1C: 4.7 % (ref 4.0–5.6)

## 2020-12-11 MED ORDER — LEVETIRACETAM 500 MG PO TABS
500.0000 mg | ORAL_TABLET | Freq: Two times a day (BID) | ORAL | 1 refills | Status: DC
  Filled 2020-12-11: qty 60, 30d supply, fill #0

## 2020-12-11 NOTE — Progress Notes (Signed)
[facility] Patient [facility] [address] Anastasia Pall [place], [place]  [address] Phone:  [phone]   Fax:  [phone] Subjective:   Patient ID: Sean [name], male    DOB: 2000/11/05, 20 y.o.   MRN: [id number]  Chief Complaint  Patient presents with   Establish Care    ED 8/20, seizures. Neurology appt 9/21   HPI Sean [name] 20 y.o. male presents with history of seizures to the [facility] to establish care.Uncertain of previous visit with PCP. Last seizure 11/16/2020, denies any injury. States, " I black out and wake up (in the emergency room)." Has been prescribed Keppra, but has never taken due to finances. Currently not working, hoping to start at the [employer]. Pays bills with money he saved from previous job. Denies receiving of being informed of any prescription assistance. Currently lives alone, refugee that arrive din [place] in 2018. Denies any sexual activity in the past 6 mths. Endorses drinking 3 beers/ day, states that he has attempted to stop in the past, but has been unsuccessful. Also endorses not currently having transportation.  Denies any other complaints. Monitors diet and exercises daily.   Past Medical History:  Diagnosis Date   Seizures (HCC)     History reviewed. No pertinent surgical history.  History reviewed. No pertinent family history.  Social History   Socioeconomic History   Marital status: Single    Spouse name: Not on file   Number of children: Not on file   Years of education: Not on file   Highest education level: Not on file  Occupational History   Not on file  Tobacco Use   Smoking status: Never   Smokeless tobacco: Never  Vaping Use   Vaping Use: Never used  Substance and Sexual Activity   Alcohol use: Yes    Alcohol/week: 3.0 standard drinks    Types: 3 Cans of beer per week    Comment: 3 drinks a day.   Drug use: Never   Sexual activity: Not Currently  Other Topics Concern   Not on file  Social History Narrative   Live alone in his apartment.     Social Determinants of Health   Financial Resource Strain: Not on file  Food Insecurity: Not on file  Transportation Needs: Not on file  Physical Activity: Not on file  Stress: Not on file  Social Connections: Not on file  Intimate Partner Violence: Not on file    Outpatient Medications Prior to Visit  Medication Sig Dispense Refill   levETIRAcetam (KEPPRA) 500 MG tablet Take 1 tablet (500 mg total) by mouth 2 (two) times daily. (Patient not taking: Reported on 12/11/2020) 60 tablet 1   No facility-administered medications prior to visit.    No Known Allergies  Review of Systems  Constitutional:  Negative for chills, fever and malaise/fatigue.  Respiratory:  Negative for cough and shortness of breath.   Cardiovascular:  Negative for chest pain, palpitations and leg swelling.  Gastrointestinal:  Negative for abdominal pain, blood in stool, constipation, diarrhea, nausea and vomiting.  Skin: Negative.   Neurological:  Positive for seizures.  Psychiatric/Behavioral:  Negative for depression. The patient is not nervous/anxious.   All other systems reviewed and are negative.     Objective:    Physical Exam Vitals reviewed.  Constitutional:      General: He is not in acute distress.    Appearance: Normal appearance.  HENT:     Head: Normocephalic.     Right Ear: Tympanic membrane,  ear canal and external ear normal.     Left Ear: Tympanic membrane, ear canal and external ear normal.     Nose: Nose normal.     Mouth/Throat:     Mouth: Mucous membranes are moist.  Eyes:     Extraocular Movements: Extraocular movements intact.     Conjunctiva/sclera: Conjunctivae normal.     Pupils: Pupils are equal, round, and reactive to light.  Cardiovascular:     Rate and Rhythm: Normal rate and regular rhythm.     Pulses: Normal pulses.     Heart sounds: Normal heart sounds.     Comments: No obvious peripheral edema Pulmonary:     Effort: Pulmonary effort is normal.     Breath  sounds: Normal breath sounds.  Abdominal:     General: Abdomen is flat. Bowel sounds are normal.     Palpations: Abdomen is soft.  Musculoskeletal:        General: Normal range of motion.     Cervical back: Normal range of motion and neck supple.  Skin:    General: Skin is warm and dry.     Capillary Refill: Capillary refill takes less than 2 seconds.  Neurological:     General: No focal deficit present.     Mental Status: He is alert and oriented to person, place, and time. Mental status is at baseline.  Psychiatric:        Mood and Affect: Mood normal.        Behavior: Behavior normal.        Thought Content: Thought content normal.        Judgment: Judgment normal.    BP 131/82   Pulse 90   Temp 98.1 F (36.7 C)   Ht 5' 5.5" (1.664 m)   Wt 134 lb 0.4 oz (60.8 kg)   SpO2 98%   BMI 21.96 kg/m  Wt Readings from Last 3 Encounters:  12/11/20 134 lb 0.4 oz (60.8 kg)  11/16/20 140 lb (63.5 kg)  11/06/19 132 lb 4.4 oz (60 kg)    Immunization History  Administered Date(s) Administered   Influenza,inj,Quad PF,6+ Mos 01/10/2018    Diabetic Foot Exam - Simple   No data filed     No results found for: TSH Lab Results  Component Value Date   WBC 6.8 11/16/2020   HGB 16.0 11/16/2020   HCT 44.3 11/16/2020   MCV 90.4 11/16/2020   PLT 133 (L) 11/16/2020   Lab Results  Component Value Date   NA 138 11/16/2020   K 3.5 11/16/2020   CO2 23 11/16/2020   GLUCOSE 115 (H) 11/16/2020   BUN <5 (L) 11/16/2020   CREATININE 0.57 (L) 11/16/2020   BILITOT 1.0 09/30/2020   ALKPHOS 88 09/30/2020   AST 33 09/30/2020   ALT 38 09/30/2020   PROT 8.5 (H) 09/30/2020   ALBUMIN 4.8 09/30/2020   CALCIUM 9.6 11/16/2020   ANIONGAP 13 11/16/2020   No results found for: CHOL No results found for: HDL No results found for: LDLCALC No results found for: TRIG No results found for: CHOLHDL Lab Results  Component Value Date   HGBA1C 4.7 12/11/2020       Assessment & Plan:   Problem  List Items Addressed This Visit   None Visit Diagnoses     Healthcare maintenance    -  Primary   Relevant Orders   POCT URINALYSIS DIP (CLINITEK) (Completed)   CBC with Differential/Platelet   Comprehensive metabolic panel   Lipid  panel   HIV antibody (with reflex)   Screening for diabetes mellitus       Relevant Orders   HgB A1c (Completed): 4.7, unremarkable and reassuring    Glucose (CBG) (Completed)   Seizure (HCC)       Relevant Medications   levETIRAcetam (KEPPRA) 500 MG tablet Prescription transferred to [facility], patient informed that he will be able to receive as reduced rate at this location and encouraged to take as prescribed Educated on possible complications of seizure disorder Encouraged maintain upcoming follow up with neurology   Financial difficulty     Patient referred to in clinic LCSW, Darl Pikes  Received information concerning healthcare assistance   Lack of access to transportation     Patient referred to in clinic LCSW, Darl Pikes  Alcohol Dependence  Scheduled follow up with LCSW to discuss resources    Follow up in 3 mth for reevaluation of medications compliance and alcohol dependence    I am having Conni Elliot maintain his levETIRAcetam.  Meds ordered this encounter  Medications   levETIRAcetam (KEPPRA) 500 MG tablet    Sig: Take 1 tablet (500 mg total) by mouth 2 (two) times daily.    Dispense:  60 tablet    Refill:  1     Kathrynn Speed, NP

## 2020-12-11 NOTE — Progress Notes (Signed)
Integrated Behavioral Health Case Management Referral Note  12/11/2020 Name: Sean [name] MRN: [id number] DOB: 24-Dec-2000 Sean [name] is a 20 y.o. year old male who sees Passmore, Jake Church I, NP for primary care. LCSW was consulted to assess patient's needs and assist the patient with [employer]  and substance abuse counseling and resources.  Interpreter: Yes.     Interpreter Name & Language: Swahili  Assessment: Patient experiencing substance use issues; he reports drinking 3-4 beers per day. 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 He also needs help with transportation to appointments and to pick up his medications.  Intervention: CSW met with patient with help of interpreter during PCP visit today. Patient needs help with transportation. Enrolled patient in [facility] [employer] services and scheduled ride home for patient today.   Motivational interviewing to discuss substance use. Patient would like to reduce his alcohol use and is interested in counseling. He requires a Engineer, manufacturing. Patient consented for CSW to discuss resources for this with his [place] case worker and coordinate with her, as his phone is not currently working. CSW and patient also scheduled [place] ([facility]) appointment for counseling 12/20/20.   CSW to follow to arrange transportation to this appointment and for patient to pick up his medications at [place] ([facility]) pharmacy.    Review of patient status, including review of consultants reports, relevant laboratory and other test results, and collaboration with appropriate care team members and the patient's provider was performed as part of comprehensive patient evaluation and provision of services.    Estanislado Emms, [facility] [phone]

## 2020-12-11 NOTE — Patient Instructions (Signed)
You were seen today in the [facility] for seizure disorder and to establish care. Labs were collected, results will be available via MyChart or, if abnormal, you will be contacted by clinic staff. You were prescribed medications, please take as directed. Please follow up in 3 mths for reevaluation. Please maintain your upcoming appointment with neurology.

## 2020-12-12 ENCOUNTER — Other Ambulatory Visit: Payer: Self-pay

## 2020-12-12 ENCOUNTER — Telehealth: Payer: Self-pay | Admitting: Clinical

## 2020-12-12 LAB — COMPREHENSIVE METABOLIC PANEL WITH GFR
ALT: 36 IU/L (ref 0–44)
AST: 58 IU/L — ABNORMAL HIGH (ref 0–40)
Albumin/Globulin Ratio: 1.5 (ref 1.2–2.2)
Albumin: 5.2 g/dL (ref 4.1–5.2)
Alkaline Phosphatase: 111 IU/L (ref 44–121)
BUN/Creatinine Ratio: 10 (ref 9–20)
BUN: 7 mg/dL (ref 6–20)
Bilirubin Total: 0.3 mg/dL (ref 0.0–1.2)
CO2: 24 mmol/L (ref 20–29)
Calcium: 9.4 mg/dL (ref 8.7–10.2)
Chloride: 102 mmol/L (ref 96–106)
Creatinine, Ser: 0.73 mg/dL — ABNORMAL LOW (ref 0.76–1.27)
Globulin, Total: 3.4 g/dL (ref 1.5–4.5)
Glucose: 64 mg/dL — ABNORMAL LOW (ref 65–99)
Potassium: 4.1 mmol/L (ref 3.5–5.2)
Sodium: 146 mmol/L — ABNORMAL HIGH (ref 134–144)
Total Protein: 8.6 g/dL — ABNORMAL HIGH (ref 6.0–8.5)
eGFR: 133 mL/min/1.73

## 2020-12-12 LAB — CBC WITH DIFFERENTIAL/PLATELET
Basophils Absolute: 0 x10E3/uL (ref 0.0–0.2)
Basos: 1 %
EOS (ABSOLUTE): 0.1 x10E3/uL (ref 0.0–0.4)
Eos: 1 %
Hematocrit: 43.7 % (ref 37.5–51.0)
Hemoglobin: 15.3 g/dL (ref 13.0–17.7)
Immature Grans (Abs): 0 x10E3/uL (ref 0.0–0.1)
Immature Granulocytes: 0 %
Lymphocytes Absolute: 2.4 x10E3/uL (ref 0.7–3.1)
Lymphs: 51 %
MCH: 32.3 pg (ref 26.6–33.0)
MCHC: 35 g/dL (ref 31.5–35.7)
MCV: 92 fL (ref 79–97)
Monocytes Absolute: 0.2 x10E3/uL (ref 0.1–0.9)
Monocytes: 4 %
Neutrophils Absolute: 2 x10E3/uL (ref 1.4–7.0)
Neutrophils: 43 %
Platelets: 208 x10E3/uL (ref 150–450)
RBC: 4.73 x10E6/uL (ref 4.14–5.80)
RDW: 13.7 % (ref 11.6–15.4)
WBC: 4.7 x10E3/uL (ref 3.4–10.8)

## 2020-12-12 LAB — LIPID PANEL
Chol/HDL Ratio: 2.5 ratio (ref 0.0–5.0)
Cholesterol, Total: 198 mg/dL (ref 100–199)
HDL: 78 mg/dL
LDL Chol Calc (NIH): 110 mg/dL — ABNORMAL HIGH (ref 0–99)
Triglycerides: 52 mg/dL (ref 0–149)
VLDL Cholesterol Cal: 10 mg/dL (ref 5–40)

## 2020-12-12 LAB — HIV ANTIBODY (ROUTINE TESTING W REFLEX): HIV Screen 4th Generation wRfx: NONREACTIVE

## 2020-12-12 NOTE — Telephone Encounter (Signed)
Integrated Behavioral Health General Follow Up Note  12/12/2020 Name: Djimon Lundstrom MRN: [id number] DOB: 21-Feb-2001 Sylvanus Telford is a 20 y.o. year old male who sees Passmore, Enid Derry I, NP for primary care. LCSW was initially consulted to assess patient's needs and assist the patient with [facility]  and substance abuse counseling and resources.  Interpreter: No.   Interpreter Name & Language: none  Assessment: Patient experiencing substance use issues and transportation barriers. 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   Ongoing Intervention: Today CSW coordinated with Laurette, as patient's phone is not working. He is able to communicate with Laurette on [place], but cannot use calls or text messages. CSW scheduled transportation to pharmacy for patient to pick up his medication, and Laurette advised patient of this via [employer]. Patient missed the ride. Laurette later updated CSW that she was able to go get patient and took him to the pharmacy to get his prescription. Laurette has requested transportation assistance for patient to his upcoming appointments with neurology and [facility]. CSW to schedule this.    Review of patient status, including review of consultants reports, relevant laboratory and other test results, and collaboration with appropriate care team members and the patient's provider was performed as part of comprehensive patient evaluation and provision of services.    [name], LCSW [facility] [phone]

## 2020-12-18 ENCOUNTER — Other Ambulatory Visit: Payer: Self-pay

## 2020-12-18 ENCOUNTER — Ambulatory Visit: Payer: Medicaid Other | Admitting: Neurology

## 2020-12-18 ENCOUNTER — Encounter: Payer: Self-pay | Admitting: Neurology

## 2020-12-18 ENCOUNTER — Telehealth: Payer: Self-pay | Admitting: Neurology

## 2020-12-18 VITALS — BP 117/72 | HR 81 | Ht 65.0 in | Wt 136.0 lb

## 2020-12-18 DIAGNOSIS — G40909 Epilepsy, unspecified, not intractable, without status epilepticus: Secondary | ICD-10-CM

## 2020-12-18 DIAGNOSIS — R569 Unspecified convulsions: Secondary | ICD-10-CM

## 2020-12-18 MED ORDER — LEVETIRACETAM 500 MG PO TABS
500.0000 mg | ORAL_TABLET | Freq: Two times a day (BID) | ORAL | 4 refills | Status: DC
  Filled 2020-12-18: qty 180, 90d supply, fill #0

## 2020-12-18 NOTE — Patient Instructions (Signed)
Continue with Keppra 500 mg BID  Will check Keppra level today, EEG and MRI  Return in 3 months for follow up  Per [facility] statutes, patients with seizures are not allowed to drive until they have been seizure-free for six months.  Other recommendations include using caution when using heavy equipment or power tools. Avoid working on ladders or at heights. Take showers instead of baths.  Do not swim alone.  Ensure the water temperature is not too high on the home water heater. Do not go swimming alone. Do not lock yourself in a room alone (i.e. bathroom). When caring for infants or small children, sit down when holding, feeding, or changing them to minimize risk of injury to the child in the event you have a seizure. Maintain good sleep hygiene. Avoid alcohol.  Also recommend adequate sleep, hydration, good diet and minimize stress.   During the Seizure  - First, ensure adequate ventilation and place patients on the floor on their left side  Loosen clothing around the neck and ensure the airway is patent. If the patient is clenching the teeth, do not force the mouth open with any object as this can cause severe damage - Remove all items from the surrounding that can be hazardous. The patient may be oblivious to what's happening and may not even know what he or she is doing. If the patient is confused and wandering, either gently guide him/her away and block access to outside areas - Reassure the individual and be comforting - Call 911. In most cases, the seizure ends before EMS arrives. However, there are cases when seizures may last over 3 to 5 minutes. Or the individual may have developed breathing difficulties or severe injuries. If a pregnant patient or a person with diabetes develops a seizure, it is prudent to call an ambulance. - Finally, if the patient does not regain full consciousness, then call EMS. Most patients will remain confused for about 45 to 90 minutes after a seizure, so  you must use judgment in calling for help. - Avoid restraints but make sure the patient is in a bed with padded side rails - Place the individual in a lateral position with the neck slightly flexed; this will help the saliva drain from the mouth and prevent the tongue from falling backward - Remove all nearby furniture and other hazards from the area - Provide verbal assurance as the individual is regaining consciousness - Provide the patient with privacy if possible - Call for help and start treatment as ordered by the caregiver   After the Seizure (Postictal Stage)  After a seizure, most patients experience confusion, fatigue, muscle pain and/or a headache. Thus, one should permit the individual to sleep. For the next few days, reassurance is essential. Being calm and helping reorient the person is also of importance.  Most seizures are painless and end spontaneously. Seizures are not harmful to others but can lead to complications such as stress on the lungs, brain and the heart. Individuals with prior lung problems may develop labored breathing and respiratory distress.

## 2020-12-18 NOTE — Progress Notes (Signed)
[facility]  PATIENT: Sean [name] DOB: Jul 29, 2000  REFERRING CLINICIAN: Petrucelli, Samantha R,* HISTORY FROM: Patient via interpreter TIM  REASON FOR VISIT: Seizure disorder    HISTORICAL  CHIEF COMPLAINT:  Chief Complaint  Patient presents with   New Patient (Initial Visit)    New pt, ED fu for seizures, with interpreter, states he is doing well,      HISTORY OF PRESENT ILLNESS:  This is a 20 year old gentleman with past medical history of seizure disorder who is presenting to establish care for his seizure disorder.  Patient said that his seizures started in 07-31-2009 after parents died.  Seizures are described as fall with loss of consciousness then he will wake up after a while.  He does not recall bystander or people who witnessed the seizure telling him that he has shaking with the episode.  Currently he is having about 2 seizures per month, recall that this month he did have a seizure but does not remember which day it happened.  He has been started on Keppra 500 mg twice daily but there is medication nonadherence due to running out of medication and not be able to afford his medication.  He does report a family history of seizure or, one of his cousin has seizure but no other seizure risk factors identified.  With his seizure he has a bump and bruises, last injury is a laceration on his forehead. Other issues discussed today is his alcohol abuse.  He states that he drinks almost every day whenever he has money.  When he drinks, he can drink up to 8 beer a day.  He has established care with a primary care doctor and is currently seeing a Child psychotherapist to to help assist with his alcohol abuse.  Patient reported currently he is not driving.     Handedness: Right handed   Seizure Type: Will fall and lose consciousness   Current frequency: 2/months, will happen more when drinking   Any injuries from seizures: Head laceration, bruises   Seizure risk factors: Cousin  with seizure  Previous ASMs: Levetiracetam   Currenty ASMs: Levetiracetam   ASMs side effects: Low lying tonsil,   Brain Images: CT: No acute intracranial abnormality, low-lying cerebellar tonsil, right greater than left.  Previous EEGs: None   OTHER MEDICAL CONDITIONS: None   REVIEW OF SYSTEMS: Full 14 system review of systems performed and negative with exception of: as noted in the HPI  ALLERGIES: No Known Allergies  HOME MEDICATIONS: Outpatient Medications Prior to Visit  Medication Sig Dispense Refill   levETIRAcetam (KEPPRA) 500 MG tablet Take 1 tablet (500 mg total) by mouth 2 (two) times daily. 60 tablet 1   No facility-administered medications prior to visit.    PAST MEDICAL HISTORY: Past Medical History:  Diagnosis Date   Seizures (HCC)     PAST SURGICAL HISTORY: History reviewed. No pertinent surgical history.  FAMILY HISTORY: History reviewed. No pertinent family history.  SOCIAL HISTORY: Social History   Socioeconomic History   Marital status: Single    Spouse name: Not on file   Number of children: Not on file   Years of education: Not on file   Highest education level: Not on file  Occupational History   Not on file  Tobacco Use   Smoking status: Never   Smokeless tobacco: Never  Vaping Use   Vaping Use: Never used  Substance and Sexual Activity   Alcohol use: Yes    Alcohol/week: 3.0 standard drinks  Types: 3 Cans of beer per week    Comment: 3 drinks a day.   Drug use: Never   Sexual activity: Not Currently  Other Topics Concern   Not on file  Social History Narrative   Live alone in his apartment.    Social Determinants of Health   Financial Resource Strain: Not on file  Food Insecurity: Not on file  Transportation Needs: Not on file  Physical Activity: Not on file  Stress: Not on file  Social Connections: Not on file  Intimate Partner Violence: Not on file     PHYSICAL EXAM  GENERAL EXAM/CONSTITUTIONAL: Vitals:   Vitals:   12/18/20 0834  BP: 117/72  Pulse: 81  Weight: 136 lb (61.7 kg)  Height: 5\' 5"  (1.651 m)   Body mass index is 22.63 kg/m. Wt Readings from Last 3 Encounters:  12/18/20 136 lb (61.7 kg)  12/11/20 134 lb 0.4 oz (60.8 kg)  11/16/20 140 lb (63.5 kg)   Patient is in no distress; well developed, nourished and groomed; neck is supple  CARDIOVASCULAR: Examination of carotid arteries is normal; no carotid bruits Regular rate and rhythm, no murmurs Examination of peripheral vascular system by observation and palpation is normal  EYES: Pupils round and reactive to light, Visual fields full to confrontation, Extraocular movements intacts,   MUSCULOSKELETAL: Gait, strength, tone, movements noted in Neurologic exam below  NEUROLOGIC: MENTAL STATUS:  awake, alert, oriented to person, place and time recent and remote memory intact normal attention and concentration language fluent, comprehension intact, naming intact fund of knowledge appropriate  CRANIAL NERVE: 2nd, 3rd, 4th, 6th - pupils equal and reactive to light, visual fields full to confrontation, extraocular muscles intact, no nystagmus 5th - facial sensation symmetric 7th - facial strength symmetric 8th - hearing intact 9th - palate elevates symmetrically, uvula midline 11th - shoulder shrug symmetric 12th - tongue protrusion midline  MOTOR:  normal bulk and tone, full strength in the BUE, BLE  SENSORY:  normal and symmetric to light touch, pinprick, temperature, vibration  COORDINATION:  finger-nose-finger, fine finger movements normal  REFLEXES:  deep tendon reflexes present and symmetric  GAIT/STATION:  normal    DIAGNOSTIC DATA (LABS, IMAGING, TESTING) - I reviewed patient records, labs, notes, testing and imaging myself where available.  Lab Results  Component Value Date   WBC 4.7 12/11/2020   HGB 15.3 12/11/2020   HCT 43.7 12/11/2020   MCV 92 12/11/2020   PLT 208 12/11/2020       Component Value Date/Time   NA 146 (H) 12/11/2020 1235   K 4.1 12/11/2020 1235   CL 102 12/11/2020 1235   CO2 24 12/11/2020 1235   GLUCOSE 64 (L) 12/11/2020 1235   GLUCOSE 115 (H) 11/16/2020 0708   BUN 7 12/11/2020 1235   CREATININE 0.73 (L) 12/11/2020 1235   CALCIUM 9.4 12/11/2020 1235   PROT 8.6 (H) 12/11/2020 1235   ALBUMIN 5.2 12/11/2020 1235   AST 58 (H) 12/11/2020 1235   ALT 36 12/11/2020 1235   ALKPHOS 111 12/11/2020 1235   BILITOT 0.3 12/11/2020 1235   GFRNONAA >60 11/16/2020 0708   GFRAA >60 12/01/2019 1533   Lab Results  Component Value Date   CHOL 198 12/11/2020   HDL 78 12/11/2020   LDLCALC 110 (H) 12/11/2020   TRIG 52 12/11/2020   Lab Results  Component Value Date   HGBA1C 4.7 12/11/2020   No results found for: VITAMINB12 No results found for: TSH  Head CT 1. No acute  intracranial abnormality. 2. 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 Recommend nonemergent MRI characterization  I personally reviewed brain Images.   ASSESSMENT AND PLAN  20 y.o. year old male  with history of seizure disorder who is presenting to establish care.  Per patient he has been having seizure since 2011, currently having about 2 seizures per month.  He does report medication non adherence due to not being able to afford his medication.  There is also a history of alcohol abuse.  I will check a Keppra level today.  I will also obtain a routine EEG.  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  I will order brain MRI for epilepsy work-up but to clarify if patient had does have Chiari I malformation or not.  I will contact the patient to discuss these results.  I will see him in 3 months.  I also spent a lot of time discussing his seizure disorder, seizure risk factor, and seizure and alcohol.  I strongly recommended patient to stop drinking.  He has a Child psychotherapist that is  helping him set up some alcohol abuse program to help with his drinking. We also discussed about driving restriction.    1. Seizure disorder (HCC)   2. Seizure (HCC)      PLAN: Continue with Keppra 500 mg BID  Will check Keppra level today, EEG and MRI  Return in 3 months for follow up  Per [facility] statutes, patients with seizures are not allowed to drive until they have been seizure-free for six months.  Other recommendations include using caution when using heavy equipment or power tools. Avoid working on ladders or at heights. Take showers instead of baths.  Do not swim alone.  Ensure the water temperature is not too high on the home water heater. Do not go swimming alone. Do not lock yourself in a room alone (i.e. bathroom). When caring for infants or small children, sit down when holding, feeding, or changing them to minimize risk of injury to the child in the event you have a seizure. Maintain good sleep hygiene. Avoid alcohol.  Also recommend adequate sleep, hydration, good diet and minimize stress.   During the Seizure  - First, ensure adequate ventilation and place patients on the floor on their left side  Loosen clothing around the neck and ensure the airway is patent. If the patient is clenching the teeth, do not force the mouth open with any object as this can cause severe damage - Remove all items from the surrounding that can be hazardous. The patient may be oblivious to what's happening and may not even know what he or she is doing. If the patient is confused and wandering, either gently guide him/her away and block access to outside areas - Reassure the individual and be comforting - Call 911. In most cases, the seizure ends before EMS arrives. However, there are cases when seizures may last over 3 to 5 minutes. Or the individual may have developed breathing difficulties or severe injuries. If a pregnant patient or a person with diabetes develops a seizure, it is  prudent to call an ambulance. - Finally, if the patient does not regain full consciousness, then call EMS. Most patients will remain confused for about 45 to 90 minutes after a seizure, so you must use judgment in calling for help. - Avoid restraints but make sure the patient  is in a bed with padded side rails - Place the individual in a lateral position with the neck slightly flexed; this will help the saliva drain from the mouth and prevent the tongue from falling backward - Remove all nearby furniture and other hazards from the area - Provide verbal assurance as the individual is regaining consciousness - Provide the patient with privacy if possible - Call for help and start treatment as ordered by the caregiver   After the Seizure (Postictal Stage)  After a seizure, most patients experience confusion, fatigue, muscle pain and/or a headache. Thus, one should permit the individual to sleep. For the next few days, reassurance is essential. Being calm and helping reorient the person is also of importance.  Most seizures are painless and end spontaneously. Seizures are not harmful to others but can lead to complications such as stress on the lungs, brain and the heart. Individuals with prior lung problems may develop labored breathing and respiratory distress.     Orders Placed This Encounter  Procedures   MR BRAIN W WO CONTRAST   Levetiracetam level   EEG adult    Meds ordered this encounter  Medications   levETIRAcetam (KEPPRA) 500 MG tablet    Sig: Take 1 tablet (500 mg total) by mouth 2 (two) times daily.    Dispense:  180 tablet    Refill:  4    Return in about 3 months (around 03/19/2021).    Windell Norfolk, MD 12/18/2020, 9:18 AM  [facility] [address], [address] [place], [place] [address] [phone]

## 2020-12-18 NOTE — Telephone Encounter (Signed)
EEG order sent to [facility] for scheduling. 

## 2020-12-19 ENCOUNTER — Telehealth: Payer: Self-pay | Admitting: Neurology

## 2020-12-19 NOTE — Telephone Encounter (Signed)
[facility] pending uploaded notes on the portal 

## 2020-12-20 ENCOUNTER — Other Ambulatory Visit: Payer: Self-pay

## 2020-12-20 ENCOUNTER — Ambulatory Visit (INDEPENDENT_AMBULATORY_CARE_PROVIDER_SITE_OTHER): Payer: Medicaid Other | Admitting: Clinical

## 2020-12-20 DIAGNOSIS — F1010 Alcohol abuse, uncomplicated: Secondary | ICD-10-CM | POA: Diagnosis not present

## 2020-12-20 LAB — LEVETIRACETAM LEVEL: Levetiracetam Lvl: 1.9 ug/mL — ABNORMAL LOW (ref 10.0–40.0)

## 2020-12-20 NOTE — BH Specialist Note (Signed)
Integrated Behavioral Health Initial In-Person Visit  MRN: [id number] Name: Sean [name]  Number of Integrated Behavioral Health Clinician visits:: 1/6 Session Start time: 11:00  Session End time: 12:00 Total time: 60 minutes  Types of Service: Individual psychotherapy  Interpretor:Yes.   Interpretor Name and Language: Jocelyn,Swahili  Subjective: Danen Lapaglia is a 20 y.o. male accompanied by  self Patient was referred by  Orion Crook  for alcohol use. Patient reports the following symptoms/concerns: alcohol use Duration of problem: ; Severity of problem: severe  Objective: Mood: Euthymic and Affect: Appropriate  Life Context: Family and Social: family here in [place] but they don't speak School/Work: enrolled at [employer] Self-Care:  Life Changes: brother was killed  Patient and/or Family's Strengths/Protective Factors: Sense of purpose  Goals Addressed: Patient will: Increase knowledge and/or ability of: healthy habits and self-management skills  Demonstrate ability to: Increase healthy adjustment to current life circumstances and decrease alcohol use  Progress towards Goals: Ongoing  Interventions: Interventions utilized: Motivational Interviewing, Supportive Counseling, and Psychoeducation and/or Health Education  Standardized Assessments completed: Not Needed  Motivational interviewing today to explore patient's readiness to change alcohol use. Patient indicated he does want to reduce alcohol use as it causes a lot of problems and gets in the way of his goals. Psychoeducation on withdrawal. Patient made plan to cut back from five beers per day to four in the next week.   Patient also facing housing insecurity and does not have income. Patient would like to work. CSW to coordinate with patient's case worker at [employer] regarding resources.  Patient and/or Family Response: Patient engaged in session.  Assessment: Patient currently experiencing alcohol  abuse.   Patient may benefit from motivational interviewing and supportive counseling to support in efforts to reduce alcohol use.  Plan: Follow up with behavioral health clinician on: 01/01/21 Referral(s): Integrated [employer] (In Clinic)  [name], LCSW [facility] [phone]

## 2020-12-23 ENCOUNTER — Telehealth: Payer: Self-pay

## 2020-12-23 NOTE — Telephone Encounter (Signed)
Checked the status on the availity portal it is still pending.

## 2020-12-23 NOTE — Telephone Encounter (Signed)
-----   Message from Windell Norfolk, MD sent at 12/23/2020  4:26 PM EDT ----- Please call and advise the patient that the Keppra level was very low and that he should be compliant with his medications.  Please remind patient to keep any upcoming appointments or tests and to call us with any interim questions, concerns, problems or updates. Thanks,   Windell Norfolk, MD

## 2020-12-23 NOTE — Telephone Encounter (Signed)
Attempted to call pt, LVM for call back  °

## 2020-12-23 NOTE — Telephone Encounter (Signed)
[facility] healthy [facility] [place]: [id number] (exp. 12/19/20 to 02/17/21) order sent to GI. They will reach out to the patient to schedule.

## 2020-12-24 ENCOUNTER — Telehealth: Payer: Self-pay | Admitting: *Deleted

## 2020-12-24 NOTE — Telephone Encounter (Signed)
The patient was contacted through the language line [phone]). Two numbers on file - able to get him at [phone]. He was informed of his lab results. He is insistent that he is taking the medication correctly. Denies any missed doses around the time of his lab draw. However, his levetiracetam level was only 1.9. Educated him on the importance of medication compliance and he verbalized understanding with the interpreter. Confirmed with him that he should be taking levetiracetam 500mg , one tablet twice daily.

## 2020-12-24 NOTE — Telephone Encounter (Signed)
Duplicate note opened in error  

## 2021-01-01 ENCOUNTER — Other Ambulatory Visit: Payer: Self-pay

## 2021-01-01 ENCOUNTER — Encounter (HOSPITAL_COMMUNITY): Payer: Self-pay

## 2021-01-01 ENCOUNTER — Ambulatory Visit: Payer: Self-pay | Admitting: Clinical

## 2021-01-01 ENCOUNTER — Emergency Department (HOSPITAL_COMMUNITY)
Admission: EM | Admit: 2021-01-01 | Discharge: 2021-01-01 | Disposition: A | Discharge status: Home / Self Care | Payer: Medicaid Other | Attending: Emergency Medicine | Admitting: Emergency Medicine

## 2021-01-01 DIAGNOSIS — Y908 Blood alcohol level of 240 mg/100 ml or more: Secondary | ICD-10-CM | POA: Insufficient documentation

## 2021-01-01 DIAGNOSIS — G40909 Epilepsy, unspecified, not intractable, without status epilepticus: Secondary | ICD-10-CM | POA: Diagnosis not present

## 2021-01-01 DIAGNOSIS — F1010 Alcohol abuse, uncomplicated: Secondary | ICD-10-CM | POA: Diagnosis not present

## 2021-01-01 DIAGNOSIS — R569 Unspecified convulsions: Secondary | ICD-10-CM | POA: Diagnosis present

## 2021-01-01 DIAGNOSIS — Z79899 Other long term (current) drug therapy: Secondary | ICD-10-CM | POA: Insufficient documentation

## 2021-01-01 HISTORY — DX: Alcohol abuse, uncomplicated: F10.10

## 2021-01-01 LAB — COMPREHENSIVE METABOLIC PANEL
ALT: 73 U/L — ABNORMAL HIGH (ref 0–44)
AST: 164 U/L — ABNORMAL HIGH (ref 15–41)
Albumin: 5.2 g/dL — ABNORMAL HIGH (ref 3.5–5.0)
Alkaline Phosphatase: 140 U/L — ABNORMAL HIGH (ref 38–126)
Anion gap: 17 — ABNORMAL HIGH (ref 5–15)
BUN: 8 mg/dL (ref 6–20)
CO2: 26 mmol/L (ref 22–32)
Calcium: 9.7 mg/dL (ref 8.9–10.3)
Chloride: 102 mmol/L (ref 98–111)
Creatinine, Ser: 0.59 mg/dL — ABNORMAL LOW (ref 0.61–1.24)
GFR, Estimated: >60 mL/min (ref >60)
Glucose, Bld: 94 mg/dL (ref 70–99)
Potassium: 4.4 mmol/L (ref 3.5–5.1)
Sodium: 145 mmol/L (ref 135–145)
Total Bilirubin: 1.1 mg/dL (ref 0.3–1.2)
Total Protein: 9.8 g/dL — ABNORMAL HIGH (ref 6.5–8.1)

## 2021-01-01 LAB — ETHANOL: Alcohol, Ethyl (B): 333 mg/dL (ref <10)

## 2021-01-01 MED ORDER — CHLORDIAZEPOXIDE HCL 25 MG PO CAPS
ORAL_CAPSULE | ORAL | 0 refills | Status: DC
  Filled 2021-01-01: qty 10, 3d supply, fill #0

## 2021-01-01 MED ORDER — CHLORDIAZEPOXIDE HCL 25 MG PO CAPS
25.0000 mg | ORAL_CAPSULE | Freq: Once | ORAL | Status: AC
  Administered 2021-01-01: 25 mg via ORAL
  Filled 2021-01-01: qty 1

## 2021-01-01 NOTE — ED Notes (Signed)
Seizure pads placed on bed

## 2021-01-01 NOTE — ED Provider Notes (Addendum)
[facility]-EMERGENCY DEPT Provider Note   CSN: [id number] Arrival date & time: 01/01/21  0944     History Chief Complaint  Patient presents with   Seizures    Sean [name] is a 20 y.o. male.  20 year old male presents after having a witnessed seizure.  Patient states that he has been consuming alcohol quite often recently.  States that this use exacerbates her seizures.  Normally takes Keppra and states has been compliant with this.  Denies any SI or HI.  Has had no emesis noted.  Last drink was before he came in today and consists of having 4 beers.  A video interpreter was used for this encounter      Past Medical History:  Diagnosis Date   Alcohol abuse    Seizures (HCC)     Patient Active Problem List   Diagnosis Date Noted   Seizure disorder (HCC) 12/11/2020    No past surgical history on file.     No family history on file.  Social History   Tobacco Use   Smoking status: Never   Smokeless tobacco: Never  Vaping Use   Vaping Use: Never used  Substance Use Topics   Alcohol use: Yes    Alcohol/week: 3.0 standard drinks    Types: 3 Cans of beer per week    Comment: 3 drinks a day.   Drug use: Never    Home Medications Prior to Admission medications   Medication Sig Start Date End Date Taking? Authorizing Provider  levETIRAcetam (KEPPRA) 500 MG tablet Take 1 tablet (500 mg total) by mouth 2 (two) times daily. 12/18/20 03/18/21  Windell Norfolk, MD    Allergies    Patient has no known allergies.  Review of Systems   Review of Systems  All other systems reviewed and are negative.  Physical Exam Updated Vital Signs BP (!) 137/95 (BP Location: Left Arm)   Pulse 82   Temp 98 F (36.7 C) (Oral)   Resp 16   Ht 1.651 m (5\' 5" )   Wt 61.2 kg   SpO2 99%   BMI 22.47 kg/m   Physical Exam Vitals and nursing note reviewed.  Constitutional:      General: He is not in acute distress.    Appearance: Normal appearance. He is  well-developed. He is not toxic-appearing.  HENT:     Head: Normocephalic and atraumatic.  Eyes:     General: Lids are normal.     Conjunctiva/sclera: Conjunctivae normal.     Pupils: Pupils are equal, round, and reactive to light.  Neck:     Thyroid: No thyroid mass.     Trachea: No tracheal deviation.  Cardiovascular:     Rate and Rhythm: Normal rate and regular rhythm.     Heart sounds: Normal heart sounds. No murmur heard.   No gallop.  Pulmonary:     Effort: Pulmonary effort is normal. No respiratory distress.     Breath sounds: Normal breath sounds. No stridor. No decreased breath sounds, wheezing, rhonchi or rales.  Abdominal:     General: There is no distension.     Palpations: Abdomen is soft.     Tenderness: There is no abdominal tenderness. There is no rebound.  Musculoskeletal:        General: No tenderness. Normal range of motion.     Cervical back: Normal range of motion and neck supple.  Skin:    General: Skin is warm and dry.     Findings: No  abrasion or rash.  Neurological:     Mental Status: He is alert and oriented to person, place, and time. Mental status is at baseline.     GCS: GCS eye subscore is 4. GCS verbal subscore is 5. GCS motor subscore is 6.     Cranial Nerves: Cranial nerves are intact. No cranial nerve deficit.     Sensory: No sensory deficit.     Motor: Motor function is intact.  Psychiatric:        Attention and Perception: Attention normal.        Mood and Affect: Affect is flat.        Speech: Speech normal.        Behavior: Behavior normal.    ED Results / Procedures / Treatments   Labs (all labs ordered are listed, but only abnormal results are displayed) Labs Reviewed  COMPREHENSIVE METABOLIC PANEL  ETHANOL    EKG EKG Interpretation  Date/Time:  Wednesday January 01 2021 10:55:42 EDT Ventricular Rate:  80 PR Interval:  158 QRS Duration: 95 QT Interval:  362 QTC Calculation: 418 R Axis:   62 Text Interpretation: Sinus  rhythm ST elevation suggests acute pericarditis Confirmed by Lorre Nick ([id number]) on 01/01/2021 11:14:16 AM  Radiology No results found.  Procedures Procedures   Medications Ordered in ED Medications - No data to display  ED Course  I have reviewed the triage vital signs and the nursing notes.  Pertinent labs & imaging results that were available during my care of the patient were reviewed by me and considered in my medical decision making (see chart for details).    MDM Rules/Calculators/A&P                           Patient's alcohol over noted but he is clinically sober at this time.  Gave Librium here.  Will prescribe same and will give outpatient resources.  No seizure activity noted here Final Clinical Impression(s) / ED Diagnoses Final diagnoses:  None    Rx / DC Orders ED Discharge Orders     None        Lorre Nick, MD 01/01/21 1304    Lorre Nick, MD 01/01/21 1304

## 2021-01-01 NOTE — ED Notes (Addendum)
An After Visit Summary was printed and given to the patient. Discharge instructions given and no further questions at this time.  Pt given bus pass as requested. Pt has his phone and charger. Pt A&Ox4 and ambulatory.

## 2021-01-01 NOTE — ED Triage Notes (Signed)
"  Patient states he has a history of seizures and takes Keppra and has been taking it, but alcohol makes his seizures worse and he has been drinking. Had a seizure this morning while alone and was unwitnessed. Called for EMS. Alert and oriented x 4 now" per EMS

## 2021-01-02 ENCOUNTER — Emergency Department (HOSPITAL_COMMUNITY): Payer: Medicaid Other

## 2021-01-02 ENCOUNTER — Encounter (HOSPITAL_COMMUNITY): Payer: Self-pay | Admitting: *Deleted

## 2021-01-02 ENCOUNTER — Emergency Department (HOSPITAL_COMMUNITY)
Admission: EM | Admit: 2021-01-02 | Discharge: 2021-01-02 | Disposition: A | Discharge status: Home / Self Care | Payer: Medicaid Other | Attending: Emergency Medicine | Admitting: Emergency Medicine

## 2021-01-02 DIAGNOSIS — Z5321 Procedure and treatment not carried out due to patient leaving prior to being seen by health care provider: Secondary | ICD-10-CM | POA: Insufficient documentation

## 2021-01-02 DIAGNOSIS — R569 Unspecified convulsions: Secondary | ICD-10-CM | POA: Insufficient documentation

## 2021-01-02 MED ORDER — THIAMINE HCL 100 MG/ML IJ SOLN: 100.0000 mg | Freq: Every day | INTRAMUSCULAR | Status: DC

## 2021-01-02 MED ORDER — LORAZEPAM 1 MG PO TABS: 0.0000 mg | ORAL_TABLET | Freq: Two times a day (BID) | ORAL | Status: DC

## 2021-01-02 MED ORDER — LORAZEPAM 1 MG PO TABS: 0.0000 mg | ORAL_TABLET | Freq: Four times a day (QID) | ORAL | Status: DC

## 2021-01-02 MED ORDER — LORAZEPAM 2 MG/ML IJ SOLN: 0.0000 mg | Freq: Four times a day (QID) | INTRAMUSCULAR | Status: DC

## 2021-01-02 MED ORDER — LORAZEPAM 2 MG/ML IJ SOLN: 0.0000 mg | Freq: Two times a day (BID) | INTRAMUSCULAR | Status: DC

## 2021-01-02 MED ORDER — THIAMINE HCL 100 MG PO TABS: 100.0000 mg | ORAL_TABLET | Freq: Every day | ORAL | Status: DC

## 2021-01-02 NOTE — ED Notes (Addendum)
Patient refused lab work via Nurse, learning disability. RN made aware.

## 2021-01-02 NOTE — ED Provider Notes (Signed)
Emergency Medicine Provider Triage Evaluation Note  Sean [name] , a 20 y.o. male  was evaluated in triage.  Pt complains of seizure, seen yesterday for same. Reports frequent falls and a drinking problem. Given librium taper but did not fill or pick up rx. Was not aware he was given then. Last fell yesterday. Last etoh today, 6 pack of beer. No loss of bladder control Swahili interpreter used Reports compliance with Keppera  Review of Systems  Positive: seizure Negative: Fever, injuries  Physical Exam  BP (!) 140/95   Pulse 94   Temp 97.6 F (36.4 C) (Oral)   Resp 16   SpO2 98%  Gen:   Awake, no distress   Resp:  Normal effort  MSK:   Moves extremities without difficulty  Other:  Speech clear  Medical Decision Making  Medically screening exam initiated at 10:38 AM.  Appropriate orders placed.  Sean [name] was informed that the remainder of the evaluation will be completed by another provider, this initial triage assessment does not replace that evaluation, and the importance of remaining in the ED until their evaluation is complete.     Sean Fend, PA-C 01/02/21 1044    Sean Files, MD 01/02/21 [phone]

## 2021-01-02 NOTE — ED Triage Notes (Signed)
Per EMS, pt from home reports seizure this morning. Unwitnessed, he called EMS after seizure. He drank 6 beers this morning. Was in hospital for same yesterday.  BP 140/90 HR 102 SpO2 98 RR 16 Temp 97 CBG 130

## 2021-01-03 ENCOUNTER — Emergency Department (HOSPITAL_COMMUNITY)
Admission: EM | Admit: 2021-01-03 | Discharge: 2021-01-03 | Disposition: A | Discharge status: Home / Self Care | Payer: Medicaid Other | Attending: Emergency Medicine | Admitting: Emergency Medicine

## 2021-01-03 ENCOUNTER — Emergency Department (HOSPITAL_COMMUNITY): Payer: Medicaid Other

## 2021-01-03 ENCOUNTER — Encounter (HOSPITAL_COMMUNITY): Payer: Self-pay | Admitting: *Deleted

## 2021-01-03 ENCOUNTER — Ambulatory Visit: Payer: Self-pay | Admitting: Nurse Practitioner

## 2021-01-03 DIAGNOSIS — R7309 Other abnormal glucose: Secondary | ICD-10-CM | POA: Diagnosis not present

## 2021-01-03 DIAGNOSIS — W1839XA Other fall on same level, initial encounter: Secondary | ICD-10-CM | POA: Insufficient documentation

## 2021-01-03 DIAGNOSIS — Z79899 Other long term (current) drug therapy: Secondary | ICD-10-CM | POA: Insufficient documentation

## 2021-01-03 DIAGNOSIS — Y908 Blood alcohol level of 240 mg/100 ml or more: Secondary | ICD-10-CM | POA: Insufficient documentation

## 2021-01-03 DIAGNOSIS — K709 Alcoholic liver disease, unspecified: Secondary | ICD-10-CM

## 2021-01-03 DIAGNOSIS — F10129 Alcohol abuse with intoxication, unspecified: Secondary | ICD-10-CM | POA: Diagnosis present

## 2021-01-03 DIAGNOSIS — R569 Unspecified convulsions: Secondary | ICD-10-CM | POA: Diagnosis not present

## 2021-01-03 DIAGNOSIS — D696 Thrombocytopenia, unspecified: Secondary | ICD-10-CM

## 2021-01-03 DIAGNOSIS — F1020 Alcohol dependence, uncomplicated: Secondary | ICD-10-CM

## 2021-01-03 DIAGNOSIS — R030 Elevated blood-pressure reading, without diagnosis of hypertension: Secondary | ICD-10-CM

## 2021-01-03 DIAGNOSIS — F10920 Alcohol use, unspecified with intoxication, uncomplicated: Secondary | ICD-10-CM

## 2021-01-03 LAB — COMPREHENSIVE METABOLIC PANEL
ALT: 53 U/L — ABNORMAL HIGH (ref 0–44)
AST: 72 U/L — ABNORMAL HIGH (ref 15–41)
Albumin: 4.2 g/dL (ref 3.5–5.0)
Alkaline Phosphatase: 156 U/L — ABNORMAL HIGH (ref 38–126)
Anion gap: 11 (ref 5–15)
BUN: 5 mg/dL — ABNORMAL LOW (ref 6–20)
CO2: 25 mmol/L (ref 22–32)
Calcium: 9.1 mg/dL (ref 8.9–10.3)
Chloride: 102 mmol/L (ref 98–111)
Creatinine, Ser: 0.7 mg/dL (ref 0.61–1.24)
GFR, Estimated: >60 mL/min (ref >60)
Glucose, Bld: 126 mg/dL — ABNORMAL HIGH (ref 70–99)
Potassium: 3.8 mmol/L (ref 3.5–5.1)
Sodium: 138 mmol/L (ref 135–145)
Total Bilirubin: 0.6 mg/dL (ref 0.3–1.2)
Total Protein: 7.9 g/dL (ref 6.5–8.1)

## 2021-01-03 LAB — RAPID URINE DRUG SCREEN, HOSP PERFORMED
Amphetamines: NOT DETECTED
Barbiturates: NOT DETECTED
Benzodiazepines: NOT DETECTED
Cocaine: NOT DETECTED
Opiates: NOT DETECTED
Tetrahydrocannabinol: NOT DETECTED

## 2021-01-03 LAB — ETHANOL: Alcohol, Ethyl (B): 455 mg/dL (ref <10)

## 2021-01-03 LAB — CBC WITH DIFFERENTIAL/PLATELET
Abs Immature Granulocytes: 0 10*3/uL (ref 0.00–0.07)
Basophils Absolute: 0.1 10*3/uL (ref 0.0–0.1)
Basophils Relative: 3 %
Eosinophils Absolute: 0 10*3/uL (ref 0.0–0.5)
Eosinophils Relative: 0 %
HCT: 38.9 % — ABNORMAL LOW (ref 39.0–52.0)
Hemoglobin: 14.3 g/dL (ref 13.0–17.0)
Lymphocytes Relative: 68 %
Lymphs Abs: 3 10*3/uL (ref 0.7–4.0)
MCH: 32.4 pg (ref 26.0–34.0)
MCHC: 36.8 g/dL — ABNORMAL HIGH (ref 30.0–36.0)
MCV: 88 fL (ref 80.0–100.0)
Monocytes Absolute: 0.2 10*3/uL (ref 0.1–1.0)
Monocytes Relative: 5 %
Neutro Abs: 1.1 10*3/uL — ABNORMAL LOW (ref 1.7–7.7)
Neutrophils Relative %: 24 %
Platelets: 136 10*3/uL — ABNORMAL LOW (ref 150–400)
RBC: 4.42 MIL/uL (ref 4.22–5.81)
RDW: 12.1 % (ref 11.5–15.5)
WBC: 4.4 10*3/uL (ref 4.0–10.5)
nRBC: 0 % (ref 0.0–0.2)
nRBC: 0 /100 WBC

## 2021-01-03 LAB — CBG MONITORING, ED: Glucose-Capillary: 85 mg/dL (ref 70–99)

## 2021-01-03 NOTE — ED Notes (Signed)
Pt d/c home per MD order. Discharge summary reviewed, pt provided a bus pass per request. Ambulatory off unit. No s/s of acute distress noted at discharge.

## 2021-01-03 NOTE — ED Triage Notes (Addendum)
PT here via [facility] for ETOH withdrawal.  Pt states only 8 beers 6PM last night.  Would like to be prescribed meds to stop drinking.  Paramedics stated pt fell a few times today.  Pupils equal, reactive, though sluggish.

## 2021-01-03 NOTE — ED Provider Notes (Signed)
[facility] EMERGENCY DEPARTMENT Provider Note   CSN: [id number] Arrival date & time: 01/03/21  0214     History Chief Complaint  Patient presents with   Alcohol Intoxication    Kobee Medlen is a 20 y.o. male.  Pt with hx etoh abuse, presents with etoh intoxication. Symptoms acute onset, moderate, episodic. States drank several beers last night. Denies any specific physical c/o. No tremor or shakes. No headache. No chest pain or sob. No abd pain or nvd. No extremity pain or injury. Denies fever or chills. Pt denies other substance abuse issues. Denies depression or any thoughts of harm to self or others.  Pt relatively poor historian - level 5 caveat.   The history is provided by the patient and medical records. The history is limited by the condition of the patient.  Alcohol Intoxication Pertinent negatives include no chest pain, no abdominal pain, no headaches and no shortness of breath.      Past Medical History:  Diagnosis Date   Alcohol abuse    Seizures [facility]     Patient Active Problem List   Diagnosis Date Noted   Seizure disorder (HCC) 12/11/2020    History reviewed. No pertinent surgical history.     No family history on file.  Social History   Tobacco Use   Smoking status: Never   Smokeless tobacco: Never  Vaping Use   Vaping Use: Never used  Substance Use Topics   Alcohol use: Yes    Alcohol/week: 3.0 standard drinks    Types: 3 Cans of beer per week    Comment: 3 drinks a day.   Drug use: Never    Home Medications Prior to Admission medications   Medication Sig Start Date End Date Taking? Authorizing Provider  acetaminophen (TYLENOL) 325 MG tablet Take 650 mg by mouth every 6 (six) hours as needed for mild pain, fever or headache.    [provider]  chlordiazePOXIDE (LIBRIUM) 25 MG capsule Take 2 capsules by mouth 3 times a day for 1 day, then 1 to 2 capsules by mouth twice a day for 1 day, then 1 to 2 capsules by  mouth daily for 1 day. Patient not taking: Reported on 01/02/2021 01/01/21   Lorre Nick, MD  levETIRAcetam (KEPPRA) 500 MG tablet Take 1 tablet (500 mg total) by mouth 2 (two) times daily. 12/18/20 03/18/21  Windell Norfolk, MD    Allergies    Patient has no known allergies.  Review of Systems   Review of Systems  Constitutional:  Negative for fever.  HENT:  Negative for sore throat.   Eyes:  Negative for visual disturbance.  Respiratory:  Negative for cough and shortness of breath.   Cardiovascular:  Negative for chest pain.  Gastrointestinal:  Negative for abdominal pain and vomiting.  Genitourinary:  Negative for flank pain.  Musculoskeletal:  Negative for back pain and neck pain.  Skin:  Negative for rash.  Neurological:  Negative for headaches.  Hematological:  Does not bruise/bleed easily.  Psychiatric/Behavioral:  Negative for suicidal ideas.    Physical Exam Updated Vital Signs BP (!) 138/97 (BP Location: Left Arm)   Pulse 79   Temp 98.7 F (37.1 C) (Oral)   Resp 18   SpO2 97%   Physical Exam Vitals and nursing note reviewed.  Constitutional:      Appearance: Normal appearance. He is well-developed.  HENT:     Head: Atraumatic.     Nose: Nose normal.  Mouth/Throat:     Mouth: Mucous membranes are moist.     Pharynx: Oropharynx is clear.  Eyes:     General: No scleral icterus.    Conjunctiva/sclera: Conjunctivae normal.     Pupils: Pupils are equal, round, and reactive to light.  Neck:     Trachea: No tracheal deviation.  Cardiovascular:     Rate and Rhythm: Normal rate and regular rhythm.     Pulses: Normal pulses.     Heart sounds: Normal heart sounds. No murmur heard.   No friction rub. No gallop.  Pulmonary:     Effort: Pulmonary effort is normal. No accessory muscle usage or respiratory distress.     Breath sounds: Normal breath sounds.  Chest:     Chest wall: No tenderness.  Abdominal:     General: Bowel sounds are normal. There is no  distension.     Palpations: Abdomen is soft.     Tenderness: There is no abdominal tenderness. There is no guarding.  Genitourinary:    Comments: No cva tenderness. Musculoskeletal:        General: No swelling.     Cervical back: Normal range of motion and neck supple. No rigidity or tenderness.     Comments: CTLS spine, non tender, aligned, no step off. No focal pain or bony tenderness on extremity exam.   Skin:    General: Skin is warm and dry.     Findings: No rash.  Neurological:     Mental Status: He is alert.     Comments: Alert, speech clear.  Motor/sens grossly intact bil. Steady gait.   Psychiatric:        Mood and Affect: Mood normal.    ED Results / Procedures / Treatments   Labs (all labs ordered are listed, but only abnormal results are displayed) Results for orders placed or performed during the hospital encounter of 01/03/21  Comprehensive metabolic panel  Result Value Ref Range   Sodium 138 135 - 145 mmol/L   Potassium 3.8 3.5 - 5.1 mmol/L   Chloride 102 98 - 111 mmol/L   CO2 25 22 - 32 mmol/L   Glucose, Bld 126 (H) 70 - 99 mg/dL   BUN 5 (L) 6 - 20 mg/dL   Creatinine, Ser 5.39 0.61 - 1.24 mg/dL   Calcium 9.1 8.9 - 76.7 mg/dL   Total Protein 7.9 6.5 - 8.1 g/dL   Albumin 4.2 3.5 - 5.0 g/dL   AST 72 (H) 15 - 41 U/L   ALT 53 (H) 0 - 44 U/L   Alkaline Phosphatase 156 (H) 38 - 126 U/L   Total Bilirubin 0.6 0.3 - 1.2 mg/dL   GFR, Estimated >34 >19 mL/min   Anion gap 11 5 - 15  CBC with Differential  Result Value Ref Range   WBC 4.4 4.0 - 10.5 K/uL   RBC 4.42 4.22 - 5.81 MIL/uL   Hemoglobin 14.3 13.0 - 17.0 g/dL   HCT 37.9 (L) 02.4 - 09.7 %   MCV 88.0 80.0 - 100.0 fL   MCH 32.4 26.0 - 34.0 pg   MCHC 36.8 (H) 30.0 - 36.0 g/dL   RDW 35.3 29.9 - 24.2 %   Platelets 136 (L) 150 - 400 K/uL   nRBC 0.0 0.0 - 0.2 %   Neutrophils Relative % 24 %   Neutro Abs 1.1 (L) 1.7 - 7.7 K/uL   Lymphocytes Relative 68 %   Lymphs Abs 3.0 0.7 - 4.0 K/uL   Monocytes Relative  5 %   Monocytes Absolute 0.2 0.1 - 1.0 K/uL   Eosinophils Relative 0 %   Eosinophils Absolute 0.0 0.0 - 0.5 K/uL   Basophils Relative 3 %   Basophils Absolute 0.1 0.0 - 0.1 K/uL   nRBC 0 0 /100 WBC   Abs Immature Granulocytes 0.00 0.00 - 0.07 K/uL  Ethanol  Result Value Ref Range   Alcohol, Ethyl (B) 455 (HH) <10 mg/dL  Urine rapid drug screen (hosp performed)  Result Value Ref Range   Opiates NONE DETECTED NONE DETECTED   Cocaine NONE DETECTED NONE DETECTED   Benzodiazepines NONE DETECTED NONE DETECTED   Amphetamines NONE DETECTED NONE DETECTED   Tetrahydrocannabinol NONE DETECTED NONE DETECTED   Barbiturates NONE DETECTED NONE DETECTED   CT Head Wo Contrast  Result Date: 01/03/2021 CLINICAL DATA:  Recent fall while intoxicated EXAM: CT HEAD WITHOUT CONTRAST TECHNIQUE: Contiguous axial images were obtained from the base of the skull through the vertex without intravenous contrast. COMPARISON:  CT from day previous FINDINGS: Brain: No evidence of acute infarction, hemorrhage, hydrocephalus, extra-axial collection or mass lesion/mass effect. Vascular: No hyperdense vessel or unexpected calcification. Skull: Normal. Negative for fracture or focal lesion. Sinuses/Orbits: No acute finding. Other: None. IMPRESSION: No acute intracranial abnormality noted. No significant interval change from the exam obtained on the previous day. Electronically Signed   By: Alcide Clever M.D.   On: 01/03/2021 03:25   CT Head Wo Contrast  Result Date: 01/02/2021 CLINICAL DATA:  Seizure.  Alcohol/drug related. EXAM: CT HEAD WITHOUT CONTRAST TECHNIQUE: Contiguous axial images were obtained from the base of the skull through the vertex without intravenous contrast. COMPARISON:  06/10/2019 FINDINGS: Brain: The brain appears normally formed. There is no evidence of atrophy, old or acute infarction, mass lesion, hemorrhage, hydrocephalus or extra-axial collection. Vascular: No abnormal vascular finding. Skull: Normal  Sinuses/Orbits: Clear/normal Other: None IMPRESSION: Normal head CT.  No abnormality seen to explain seizure. Electronically Signed   By: Paulina Fusi M.D.   On: 01/02/2021 11:56     EKG None  Radiology CT Head Wo Contrast  Result Date: 01/03/2021 CLINICAL DATA:  Recent fall while intoxicated EXAM: CT HEAD WITHOUT CONTRAST TECHNIQUE: Contiguous axial images were obtained from the base of the skull through the vertex without intravenous contrast. COMPARISON:  CT from day previous FINDINGS: Brain: No evidence of acute infarction, hemorrhage, hydrocephalus, extra-axial collection or mass lesion/mass effect. Vascular: No hyperdense vessel or unexpected calcification. Skull: Normal. Negative for fracture or focal lesion. Sinuses/Orbits: No acute finding. Other: None. IMPRESSION: No acute intracranial abnormality noted. No significant interval change from the exam obtained on the previous day. Electronically Signed   By: Alcide Clever M.D.   On: 01/03/2021 03:25   CT Head Wo Contrast  Result Date: 01/02/2021 CLINICAL DATA:  Seizure.  Alcohol/drug related. EXAM: CT HEAD WITHOUT CONTRAST TECHNIQUE: Contiguous axial images were obtained from the base of the skull through the vertex without intravenous contrast. COMPARISON:  06/10/2019 FINDINGS: Brain: The brain appears normally formed. There is no evidence of atrophy, old or acute infarction, mass lesion, hemorrhage, hydrocephalus or extra-axial collection. Vascular: No abnormal vascular finding. Skull: Normal Sinuses/Orbits: Clear/normal Other: None IMPRESSION: Normal head CT.  No abnormality seen to explain seizure. Electronically Signed   By: Paulina Fusi M.D.   On: 01/02/2021 11:56    Procedures Procedures   Medications Ordered in ED Medications - No data to display  ED Course  I have reviewed the triage vital signs and the nursing  notes.  Pertinent labs & imaging results that were available during my care of the patient were reviewed by me and  considered in my medical decision making (see chart for details).    MDM Rules/Calculators/A&P                          Labs sent. Observed in ED.   Reviewed nursing notes and prior charts for additional history.   Labs reviewed/interpreted by me - etoh v high. Wbc and hgb normal. Plt mildly low c/w chronic etoh use.   Po fluids/food. Ambulate in hall.  Recheck pt - no tremor or shakes.   Pt currently appears stable for d/c.   Will provide resource guide for outpt follow up with treatment programs. Also rec pcp f/u.   Return precautions provided.    Final Clinical Impression(s) / ED Diagnoses Final diagnoses:  None    Rx / DC Orders ED Discharge Orders     None        Cathren Laine, MD 01/03/21 1458

## 2021-01-03 NOTE — ED Notes (Signed)
Pt called numerous times with no answer. 

## 2021-01-03 NOTE — ED Notes (Signed)
Patient was given a [place] sandwich bag, and cup of Ginger Ale.
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
## 2021-01-03 NOTE — Discharge Instructions (Addendum)
It was our pleasure to provide your ER care today - we hope that you feel better.  Drink plenty of (non-alcoholic) fluids/stay well hydrated.   Never drink alcohol and drive, and no driving until cleared to do so by your doctor.   Follow up with primary care doctor in one week - also have blood pressure rechecked as it is high.   Avoid alcohol use, and see resource guide provided to help find and access alcohol treatment program.  Take librium as recently prescribed to help if withdrawal symptoms.   Return to ER if worse, new symptoms, fevers, chest pain, trouble breathing, or other emergency concern.

## 2021-01-04 ENCOUNTER — Emergency Department (HOSPITAL_COMMUNITY): Payer: Medicaid Other

## 2021-01-04 ENCOUNTER — Encounter (HOSPITAL_COMMUNITY): Payer: Self-pay | Admitting: *Deleted

## 2021-01-04 ENCOUNTER — Other Ambulatory Visit: Payer: Self-pay

## 2021-01-04 ENCOUNTER — Emergency Department (HOSPITAL_COMMUNITY)
Admission: EM | Admit: 2021-01-04 | Discharge: 2021-01-04 | Discharge status: Against Medical Advice | Payer: Medicaid Other | Attending: Emergency Medicine | Admitting: Emergency Medicine

## 2021-01-04 DIAGNOSIS — S0990XA Unspecified injury of head, initial encounter: Secondary | ICD-10-CM | POA: Diagnosis not present

## 2021-01-04 DIAGNOSIS — F10129 Alcohol abuse with intoxication, unspecified: Secondary | ICD-10-CM | POA: Insufficient documentation

## 2021-01-04 DIAGNOSIS — S0992XA Unspecified injury of nose, initial encounter: Secondary | ICD-10-CM | POA: Insufficient documentation

## 2021-01-04 DIAGNOSIS — Z5321 Procedure and treatment not carried out due to patient leaving prior to being seen by health care provider: Secondary | ICD-10-CM | POA: Insufficient documentation

## 2021-01-04 DIAGNOSIS — S0993XA Unspecified injury of face, initial encounter: Secondary | ICD-10-CM | POA: Insufficient documentation

## 2021-01-04 LAB — CBC WITH DIFFERENTIAL/PLATELET
Abs Immature Granulocytes: 0 10*3/uL (ref 0.00–0.07)
Basophils Absolute: 0 10*3/uL (ref 0.0–0.1)
Basophils Relative: 1 %
Eosinophils Absolute: 0 10*3/uL (ref 0.0–0.5)
Eosinophils Relative: 1 %
HCT: 40.2 % (ref 39.0–52.0)
Hemoglobin: 14.3 g/dL (ref 13.0–17.0)
Immature Granulocytes: 0 %
Lymphocytes Relative: 51 %
Lymphs Abs: 1.4 10*3/uL (ref 0.7–4.0)
MCH: 32.1 pg (ref 26.0–34.0)
MCHC: 35.6 g/dL (ref 30.0–36.0)
MCV: 90.3 fL (ref 80.0–100.0)
Monocytes Absolute: 0.1 10*3/uL (ref 0.1–1.0)
Monocytes Relative: 5 %
Neutro Abs: 1.1 10*3/uL — ABNORMAL LOW (ref 1.7–7.7)
Neutrophils Relative %: 42 %
Platelets: 118 10*3/uL — ABNORMAL LOW (ref 150–400)
RBC: 4.45 MIL/uL (ref 4.22–5.81)
RDW: 11.9 % (ref 11.5–15.5)
WBC: 2.6 10*3/uL — ABNORMAL LOW (ref 4.0–10.5)
nRBC: 0 % (ref 0.0–0.2)

## 2021-01-04 LAB — COMPREHENSIVE METABOLIC PANEL
ALT: 40 U/L (ref 0–44)
AST: 54 U/L — ABNORMAL HIGH (ref 15–41)
Albumin: 4.3 g/dL (ref 3.5–5.0)
Alkaline Phosphatase: 104 U/L (ref 38–126)
Anion gap: 8 (ref 5–15)
BUN: <5 mg/dL — ABNORMAL LOW (ref 6–20)
CO2: 31 mmol/L (ref 22–32)
Calcium: 8.9 mg/dL (ref 8.9–10.3)
Chloride: 101 mmol/L (ref 98–111)
Creatinine, Ser: 0.71 mg/dL (ref 0.61–1.24)
GFR, Estimated: >60 mL/min (ref >60)
Glucose, Bld: 92 mg/dL (ref 70–99)
Potassium: 3.7 mmol/L (ref 3.5–5.1)
Sodium: 140 mmol/L (ref 135–145)
Total Bilirubin: 0.8 mg/dL (ref 0.3–1.2)
Total Protein: 8 g/dL (ref 6.5–8.1)

## 2021-01-04 LAB — ETHANOL: Alcohol, Ethyl (B): 418 mg/dL (ref <10)
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
## 2021-01-04 NOTE — ED Notes (Addendum)
Unable to locate pt in lobby, called all number in chart including all emergency contacts, unable to contact anyone. Checked all sleeping patients in lobby, left voicemail for pt to return to the ED
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
## 2021-01-05 ENCOUNTER — Emergency Department (HOSPITAL_COMMUNITY)
Admission: EM | Admit: 2021-01-05 | Discharge: 2021-01-05 | Disposition: A | Discharge status: Home / Self Care | Payer: Medicaid Other | Attending: Emergency Medicine | Admitting: Emergency Medicine

## 2021-01-05 ENCOUNTER — Other Ambulatory Visit: Payer: Self-pay

## 2021-01-05 ENCOUNTER — Emergency Department (HOSPITAL_COMMUNITY): Payer: Medicaid Other

## 2021-01-05 ENCOUNTER — Encounter (HOSPITAL_COMMUNITY): Payer: Self-pay | Admitting: *Deleted

## 2021-01-05 DIAGNOSIS — S0990XA Unspecified injury of head, initial encounter: Secondary | ICD-10-CM | POA: Diagnosis present

## 2021-01-05 DIAGNOSIS — Z79899 Other long term (current) drug therapy: Secondary | ICD-10-CM | POA: Diagnosis not present

## 2021-01-05 DIAGNOSIS — Z20822 Contact with and (suspected) exposure to covid-19: Secondary | ICD-10-CM | POA: Insufficient documentation

## 2021-01-05 DIAGNOSIS — S0083XA Contusion of other part of head, initial encounter: Secondary | ICD-10-CM | POA: Diagnosis not present

## 2021-01-05 DIAGNOSIS — I609 Nontraumatic subarachnoid hemorrhage, unspecified: Secondary | ICD-10-CM

## 2021-01-05 LAB — RESP PANEL BY RT-PCR (FLU A&B, COVID) ARPGX2
Influenza A by PCR: NEGATIVE
Influenza B by PCR: NEGATIVE
SARS Coronavirus 2 by RT PCR: NEGATIVE

## 2021-01-05 NOTE — Discharge Instructions (Addendum)
Take your Keppra as prescribed. See Dr. Jake Samples in the office in two weeks, call for appointment.  [place] Keppra yako kama ilivyoagizwa. Mw[place] Dk. [place] ofisini baada ya wiki mbili, piga simu kwa miadi.
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
## 2021-01-05 NOTE — ED Triage Notes (Signed)
The pt had an iv placed by [facility]  shortly after he arrived he started tearing the tape off his iv   he does not want to have any more blood drawn    un-co-operative
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
## 2021-01-05 NOTE — ED Notes (Signed)
E-signature pad unavailable at time of pt discharge. This RN discussed discharge materials with pt and answered all pt questions. Pt stated understanding of discharge material. ? ?
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
## 2021-01-06 ENCOUNTER — Telehealth: Payer: Self-pay | Admitting: Clinical

## 2021-01-06 ENCOUNTER — Telehealth: Payer: Self-pay

## 2021-01-06 NOTE — Telephone Encounter (Signed)
Integrated Behavioral Health General Follow Up Note  01/06/2021 Name: Sean [name] MRN: [id number] DOB: 10/20/2000 Sean [name] is a 20 y.o. year old male who sees Passmore, Enid Derry I, NP for primary care. LCSW was initially consulted to assess patient's needs and assist the patient with [employer]  and substance abuse counseling and resources.  Interpreter: No.   Interpreter Name & Language: none  Assessment: Patient experiencing substance use issues and transportation barriers. His phone is also not working. Patient has a IT trainer with [employer], [place].   Ongoing Intervention: CSW coordinating with Laurette, as patient's phone is not working. He is able to communicate with Laurette on [place], but cannot use calls or text messages. Laurette asked last week for assistance in referring patient to a substance use rehab facility. Advised that patient will need to come into the office to complete the referrals and consent forms. Laurette advised that she can bring patient to the office. LVM today for Laurette requesting call back to schedule patient.   Also coordinating with patient's provider, Orion Crook, who would also like to see patient due to multiple ER visits recently.  Review of patient status, including review of consultants reports, relevant laboratory and other test results, and collaboration with appropriate care team members and the patient's provider was performed as part of comprehensive patient evaluation and provision of services.    [name], LCSW [facility] [phone]

## 2021-01-06 NOTE — Telephone Encounter (Signed)
-----   Message from Kathrynn Speed, NP sent at 01/03/2021 10:39 PM EDT ----- Can you please call to schedule an appointment with this patient? ASAP. Something is going on with him, I really would like to talk to him. He has had two ED visits in the past month.  Thanks

## 2021-01-06 NOTE — Telephone Encounter (Signed)
LVM to schedule EEG appt in our office. When patient calls back, please schedule with [place]. Thanks!!

## 2021-01-06 NOTE — Telephone Encounter (Signed)
Left message to call back for an appointment with Orion Crook, NP

## 2021-01-08 ENCOUNTER — Other Ambulatory Visit: Payer: Self-pay

## 2021-01-10 ENCOUNTER — Ambulatory Visit: Payer: Medicaid Other | Admitting: Clinical

## 2021-01-10 ENCOUNTER — Other Ambulatory Visit: Payer: Self-pay

## 2021-01-10 DIAGNOSIS — F1010 Alcohol abuse, uncomplicated: Secondary | ICD-10-CM

## 2021-01-10 NOTE — Progress Notes (Addendum)
Integrated Behavioral Health General Follow Up Note  01/10/2021 Name: Sean [name] MRN: [id number] DOB: [birth year] Sean [name] is a 20 y.o. year old male who sees Passmore, Jake Church I, NP for primary care. LCSW was initially consulted to assess patient's needs and assist the patient with [employer] and substance abuse counseling and resources.  Interpreter: Yes.     Interpreter Name & Language: Swahili  Assessment: Patient experiencing substance use issues and transportation barriers. Patient has a Product/process development scientist with [employer], [place], who accompanied him to his appointment today.   Ongoing Intervention: CSW met with patient and Laurette with a Swahili interpreter at the [facility]). Discussed referrals to detox treatment centers, as patient wants to stop alcohol use. Patient consented to referrals to [facility] and [facility] Advised patient that referral will be sent, and he will need to call the centers to do pre-screening over the phone. Laurette assisted them with this but found that these facilities will not admit him due to seizure disorder.   CSW following up for additional options in the community for detox for patient. Called Laurette later in the afternoon and advised her of this. LVM for patient with [employer] advising him of this and to go to [facility]) with any immediate needs.   Patient has also consented for CSW to coordinate with Laurette, as well as his caseworker, Hurley [employer], at [employer]. CSW to follow.  Review of patient status, including review of consultants reports, relevant laboratory and other test results, and collaboration with appropriate care team members and the patient's provider was performed as part of comprehensive patient evaluation and provision of services.    Estanislado Emms, [facility] [phone]

## 2021-01-13 ENCOUNTER — Telehealth: Payer: Self-pay | Admitting: Clinical

## 2021-01-13 NOTE — Telephone Encounter (Signed)
Integrated Behavioral Health General Follow Up Note  01/13/2021 Name: Acel Natzke MRN: [id number] DOB: 2001-03-27 Kaynan Klonowski is a 20 y.o. year old male who sees Passmore, Enid Derry I, NP for primary care. LCSW was initially consulted to assess patient's needs and assist the patient with [facility] and substance abuse counseling and resources.  Interpreter: Yes.     Interpreter Name & Language: Swahili  Assessment: Patient experiencing substance use issues and transportation barriers. Patient has a IT trainer with [employer], [place].  Ongoing Intervention: Called Laurette and also called patient and advised them of an option for alcohol detox for patient. Patient coming in to see PCP [facility] the [facility]) tomorrow and CSW will plan to meet with him during that time to discuss.  Review of patient status, including review of consultants reports, relevant laboratory and other test results, and collaboration with appropriate care team members and the patient's provider was performed as part of comprehensive patient evaluation and provision of services.    [name], LCSW [facility] [phone]

## 2021-01-14 ENCOUNTER — Telehealth: Payer: Self-pay | Admitting: Clinical

## 2021-01-14 ENCOUNTER — Ambulatory Visit: Payer: Self-pay | Admitting: Nurse Practitioner

## 2021-01-14 NOTE — Telephone Encounter (Signed)
Integrated Behavioral Health General Follow Up Note  01/14/2021 Name: Sean [name] MRN: [id number] DOB: [birth year] Sean [name] is a 20 y.o. year old male who sees Passmore, Enid Derry I, NP for primary care. LCSW was initially consulted to assess patient's needs and assist the patient with [employer] and substance abuse counseling and resources.  Interpreter: Yes.     Interpreter Name & Language: Swahili  Assessment: Patient experiencing substance use issues and transportation and housing barriers. Patient has a IT trainer with [employer], Diplomatic Services operational officer, and a caseworker with [employer], [place].  Ongoing Intervention: Occupational hygienist with patient's caseworkers today, as patient has consented for CSW to coordinate care with them. Patient did not attend his appointment with PCP today as scheduled.   Planned with caseworkers for Laurette to assist patient in going to [facility] for assessment for alcohol detox this Thursday. Due to patient's history of seizures, he won't be accepted at other substance use treatment centers.   [name] is also assisting patient with financial assistance for rent and utilities and in finding employment. Her agency can also provide free bus passes to patient.   Review of patient status, including review of consultants reports, relevant laboratory and other test results, and collaboration with appropriate care team members and the patient's provider was performed as part of comprehensive patient evaluation and provision of services.    [name], LCSW [facility] [phone]

## 2021-03-11 ENCOUNTER — Encounter: Payer: Self-pay | Admitting: Neurology

## 2021-03-11 ENCOUNTER — Ambulatory Visit: Payer: Medicaid Other | Admitting: Neurology

## 2021-03-12 ENCOUNTER — Ambulatory Visit: Payer: Self-pay | Admitting: Nurse Practitioner

## 2021-03-19 ENCOUNTER — Ambulatory Visit: Payer: Medicaid Other | Admitting: Neurology

## 2021-07-16 ENCOUNTER — Emergency Department (HOSPITAL_COMMUNITY): Payer: Medicaid Other

## 2021-07-16 ENCOUNTER — Encounter (HOSPITAL_COMMUNITY): Payer: Self-pay | Admitting: Emergency Medicine

## 2021-07-16 ENCOUNTER — Emergency Department (HOSPITAL_COMMUNITY)
Admission: EM | Admit: 2021-07-16 | Discharge: 2021-07-16 | Disposition: A | Discharge status: Home / Self Care | Payer: Medicaid Other | Attending: Emergency Medicine | Admitting: Emergency Medicine

## 2021-07-16 ENCOUNTER — Other Ambulatory Visit: Payer: Self-pay

## 2021-07-16 DIAGNOSIS — S0990XA Unspecified injury of head, initial encounter: Secondary | ICD-10-CM

## 2021-07-16 DIAGNOSIS — S0083XA Contusion of other part of head, initial encounter: Secondary | ICD-10-CM | POA: Insufficient documentation

## 2021-07-16 MED ORDER — ACETAMINOPHEN 325 MG PO TABS
650.0000 mg | ORAL_TABLET | Freq: Once | ORAL | Status: AC
  Administered 2021-07-16: 650 mg via ORAL
  Filled 2021-07-16: qty 2

## 2021-07-16 NOTE — ED Notes (Signed)
Reviewed discharge instructions with patient. Follow-up care and pain management reviewed. Patient verbalized understanding. Patient A&Ox4, VSS, and ambulatory with steady gait upon discharge. 
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
## 2021-07-16 NOTE — Discharge Instructions (Addendum)
It was a pleasure taking care of you today!  ? ?Your imaging was negative today in the ED. You may take over the counter 500 mg tylenol every 6 hours as needed for pain. You may apply ice to the affected area for no more than 15 minutes at a time, ensure to place a barrier between your skin and the ice. Call your primary care provider to set up a follow up appointment regarding todays ED visit. Return to the ED if you are experiencing increasing/worsening headache, vision changes, or worsening symptoms. ?
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
## 2021-07-16 NOTE — ED Notes (Signed)
Pt in CT at this time.

## 2021-09-06 IMAGING — DX DG CHEST 1V PORT
1 series · 1 of 1 positions shown · non-contrast
Comparison: 06/10/2019

CLINICAL DATA: Cough

EXAM:
PORTABLE CHEST 1 VIEW

[chest ap]
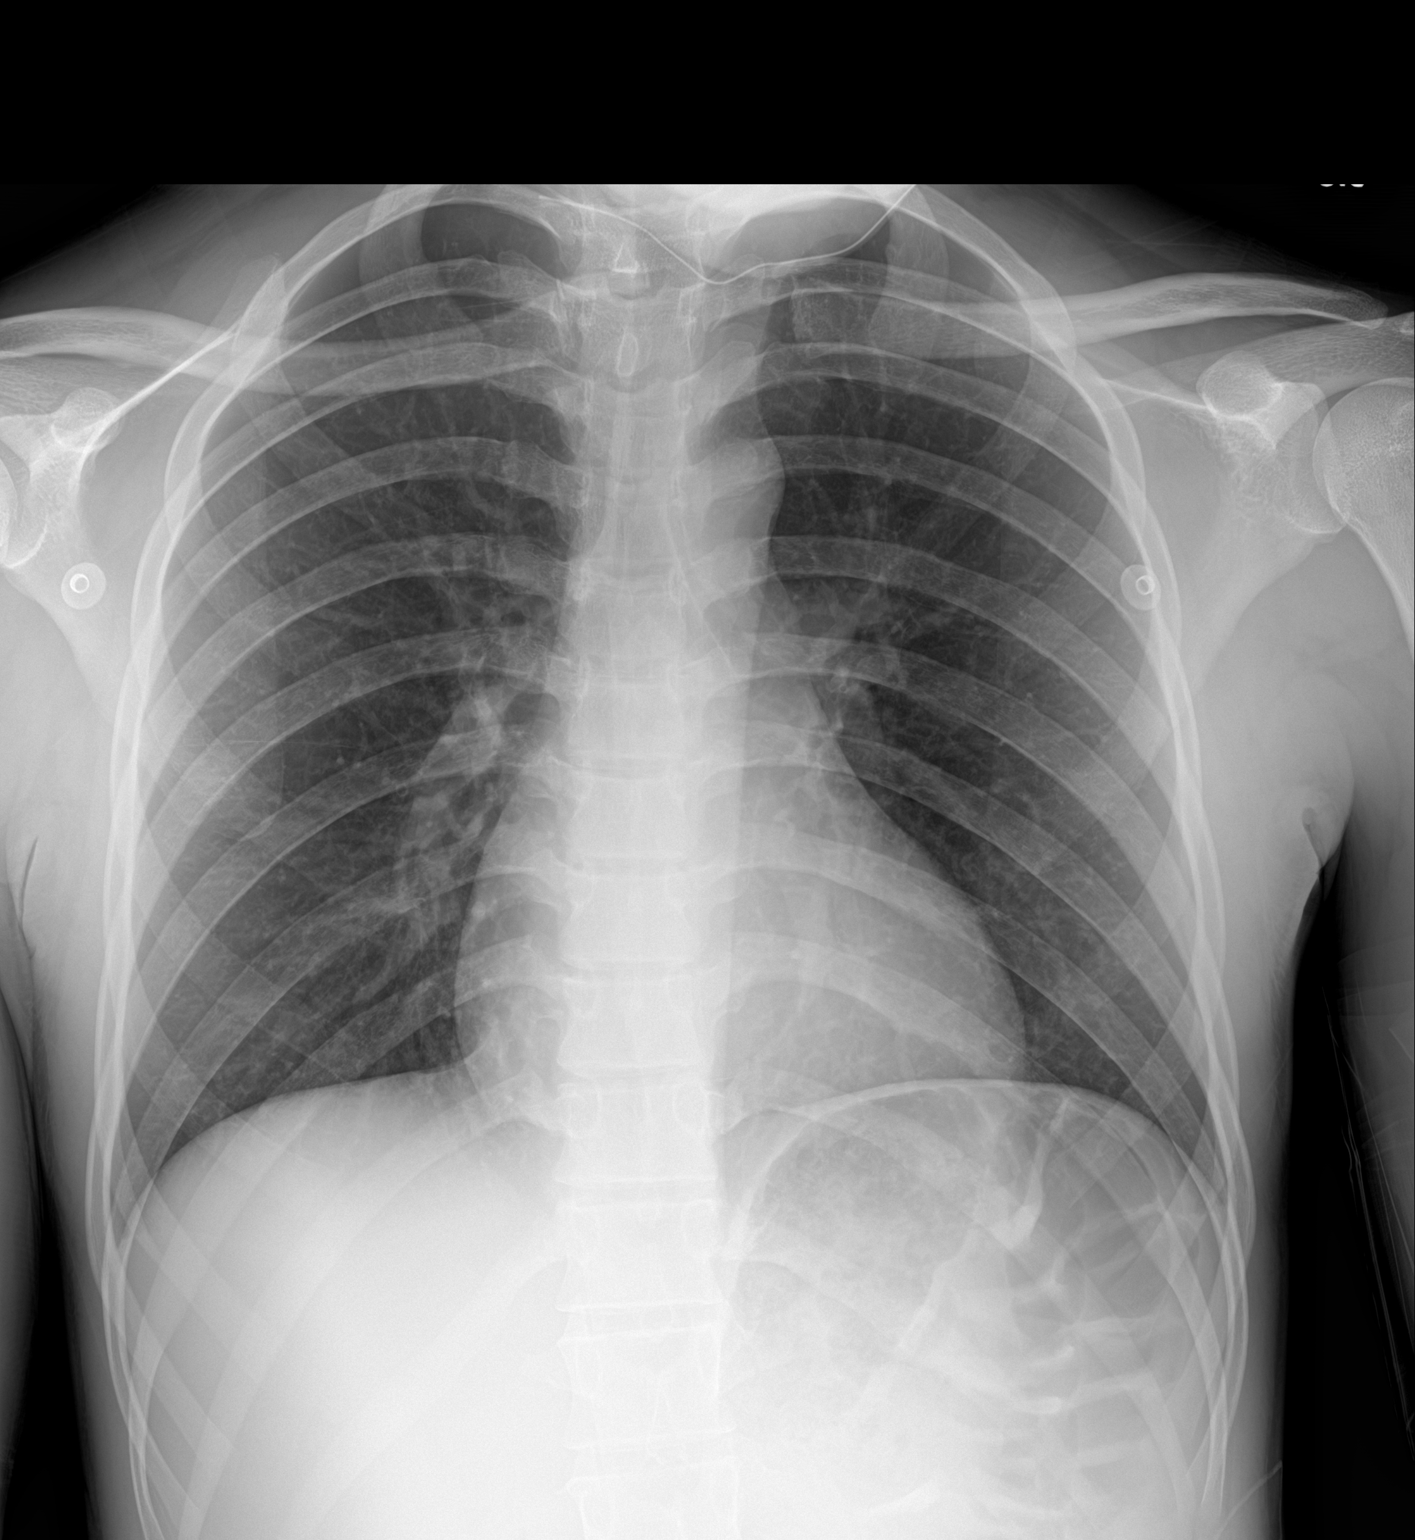

[1 of 1 positions shown; findings below may reference images not displayed]

FINDINGS: The heart size and mediastinal contours are within normal limits.
Both lungs are clear. The visualized skeletal structures are
unremarkable.
IMPRESSION: No active disease.

## 2021-09-18 ENCOUNTER — Other Ambulatory Visit: Payer: Self-pay

## 2021-09-18 ENCOUNTER — Emergency Department (HOSPITAL_COMMUNITY)
Admission: EM | Admit: 2021-09-18 | Discharge: 2021-09-18 | Disposition: A | Discharge status: Home / Self Care | Payer: Medicaid Other | Attending: Emergency Medicine | Admitting: Emergency Medicine

## 2021-09-18 ENCOUNTER — Encounter (HOSPITAL_COMMUNITY): Payer: Self-pay

## 2021-09-18 DIAGNOSIS — F1090 Alcohol use, unspecified, uncomplicated: Secondary | ICD-10-CM | POA: Diagnosis present

## 2021-09-18 DIAGNOSIS — Z789 Other specified health status: Secondary | ICD-10-CM

## 2021-09-18 MED ORDER — CHLORDIAZEPOXIDE HCL 25 MG PO CAPS
ORAL_CAPSULE | ORAL | 0 refills | Status: DC
  Filled 2021-09-18: qty 10, fill #0

## 2021-09-18 MED ORDER — CHLORDIAZEPOXIDE HCL 25 MG PO CAPS: ORAL_CAPSULE | ORAL | 0 refills | Status: DC

## 2021-09-18 NOTE — ED Notes (Signed)
Pt ambulatory without assistance.  

## 2021-09-18 NOTE — ED Triage Notes (Signed)
Pt states that he has an alcohol problem. Pt reports drinking beers (as many as he can drink). Pt appears to be intoxicated.

## 2021-09-18 NOTE — Discharge Instructions (Addendum)
You were seen in the emergency department today for help with your alcohol use.  We have prescribed you Librium which is a medication to help with alcohol withdrawal.  Do not drink alcohol while taking Librium as this can be very dangerous.  Please follow-up with attached resources.  We have prescribed you new medication(s) today. Discuss the medications prescribed today with your pharmacist as they can have adverse effects and interactions with your other medicines including over the counter and prescribed medications. Seek medical evaluation if you start to experience new or abnormal symptoms after taking one of these medicines, seek care immediately if you start to experience difficulty breathing, feeling of your throat closing, facial swelling, or rash as these could be indications of a more serious allergic reaction  Return to the emergency department for any new or worsening symptoms including but not limited to seizure activity, hallucinating, inability keep fluids down, fever, passing out, chest pain, abdominal pain, blood in vomit or stool, problems with your mental health, or any other concerns.

## 2022-01-07 ENCOUNTER — Ambulatory Visit (HOSPITAL_COMMUNITY)
Admission: EM | Admit: 2022-01-07 | Discharge: 2022-01-07 | Disposition: A | Discharge status: Home / Self Care | Payer: Medicaid Other | Attending: Psychiatry | Admitting: Psychiatry

## 2022-01-07 ENCOUNTER — Other Ambulatory Visit (HOSPITAL_COMMUNITY)
Admission: EM | Admit: 2022-01-07 | Discharge: 2022-01-14 | Disposition: A | Discharge status: Home / Self Care | Payer: Medicaid Other | Source: Home / Self Care | Attending: Student | Admitting: Student

## 2022-01-07 DIAGNOSIS — Z79899 Other long term (current) drug therapy: Secondary | ICD-10-CM | POA: Insufficient documentation

## 2022-01-07 DIAGNOSIS — Z5901 Sheltered homelessness: Secondary | ICD-10-CM | POA: Insufficient documentation

## 2022-01-07 DIAGNOSIS — G40909 Epilepsy, unspecified, not intractable, without status epilepticus: Secondary | ICD-10-CM | POA: Insufficient documentation

## 2022-01-07 DIAGNOSIS — Z5900 Homelessness unspecified: Secondary | ICD-10-CM | POA: Insufficient documentation

## 2022-01-07 DIAGNOSIS — F1010 Alcohol abuse, uncomplicated: Secondary | ICD-10-CM | POA: Insufficient documentation

## 2022-01-07 DIAGNOSIS — F1090 Alcohol use, unspecified, uncomplicated: Secondary | ICD-10-CM

## 2022-01-07 DIAGNOSIS — R569 Unspecified convulsions: Secondary | ICD-10-CM

## 2022-01-07 DIAGNOSIS — Z1152 Encounter for screening for COVID-19: Secondary | ICD-10-CM | POA: Insufficient documentation

## 2022-01-07 LAB — CBC WITH DIFFERENTIAL/PLATELET
Abs Immature Granulocytes: 0 10*3/uL (ref 0.00–0.07)
Basophils Absolute: 0 10*3/uL (ref 0.0–0.1)
Basophils Relative: 0 %
Eosinophils Absolute: 0.2 10*3/uL (ref 0.0–0.5)
Eosinophils Relative: 5 %
HCT: 41.9 % (ref 39.0–52.0)
Hemoglobin: 15.4 g/dL (ref 13.0–17.0)
Immature Granulocytes: 0 %
Lymphocytes Relative: 39 %
Lymphs Abs: 1.6 10*3/uL (ref 0.7–4.0)
MCH: 31.6 pg (ref 26.0–34.0)
MCHC: 36.8 g/dL — ABNORMAL HIGH (ref 30.0–36.0)
MCV: 85.9 fL (ref 80.0–100.0)
Monocytes Absolute: 0.2 10*3/uL (ref 0.1–1.0)
Monocytes Relative: 6 %
Neutro Abs: 2 10*3/uL (ref 1.7–7.7)
Neutrophils Relative %: 50 %
Platelets: 217 10*3/uL (ref 150–400)
RBC: 4.88 MIL/uL (ref 4.22–5.81)
RDW: 12.8 % (ref 11.5–15.5)
WBC: 4 10*3/uL (ref 4.0–10.5)
nRBC: 0 % (ref 0.0–0.2)

## 2022-01-07 LAB — RESP PANEL BY RT-PCR (FLU A&B, COVID) ARPGX2
Influenza A by PCR: NEGATIVE
Influenza B by PCR: NEGATIVE
SARS Coronavirus 2 by RT PCR: NEGATIVE

## 2022-01-07 LAB — COMPREHENSIVE METABOLIC PANEL
ALT: 53 U/L — ABNORMAL HIGH (ref 0–44)
AST: 36 U/L (ref 15–41)
Albumin: 4.7 g/dL (ref 3.5–5.0)
Alkaline Phosphatase: 97 U/L (ref 38–126)
Anion gap: 8 (ref 5–15)
BUN: 12 mg/dL (ref 6–20)
CO2: 28 mmol/L (ref 22–32)
Calcium: 9.8 mg/dL (ref 8.9–10.3)
Chloride: 100 mmol/L (ref 98–111)
Creatinine, Ser: 0.75 mg/dL (ref 0.61–1.24)
GFR, Estimated: >60 mL/min (ref >60)
Glucose, Bld: 140 mg/dL — ABNORMAL HIGH (ref 70–99)
Potassium: 3.5 mmol/L (ref 3.5–5.1)
Sodium: 136 mmol/L (ref 135–145)
Total Bilirubin: 1.2 mg/dL (ref 0.3–1.2)
Total Protein: 8.1 g/dL (ref 6.5–8.1)

## 2022-01-07 LAB — LIPID PANEL
Cholesterol: 201 mg/dL — ABNORMAL HIGH (ref 0–200)
HDL: 47 mg/dL (ref >40)
LDL Cholesterol: 127 mg/dL — ABNORMAL HIGH (ref 0–99)
Total CHOL/HDL Ratio: 4.3 RATIO
Triglycerides: 137 mg/dL (ref <150)
VLDL: 27 mg/dL (ref 0–40)

## 2022-01-07 LAB — GLUCOSE, CAPILLARY: Glucose-Capillary: 124 mg/dL — ABNORMAL HIGH (ref 70–99)

## 2022-01-07 LAB — MAGNESIUM: Magnesium: 2 mg/dL (ref 1.7–2.4)

## 2022-01-07 LAB — HEMOGLOBIN A1C
Hgb A1c MFr Bld: 4.2 % — ABNORMAL LOW (ref 4.8–5.6)
Mean Plasma Glucose: 73.84 mg/dL

## 2022-01-07 LAB — ETHANOL: Alcohol, Ethyl (B): <10 mg/dL (ref <10)

## 2022-01-07 LAB — TSH: TSH: 1.084 u[IU]/mL (ref 0.350–4.500)

## 2022-01-07 MED ORDER — LORAZEPAM 1 MG PO TABS
1.0000 mg | ORAL_TABLET | Freq: Every day | ORAL | Status: AC
  Administered 2022-01-11: 1 mg via ORAL
  Filled 2022-01-07: qty 1

## 2022-01-07 MED ORDER — LORAZEPAM 1 MG PO TABS
1.0000 mg | ORAL_TABLET | Freq: Four times a day (QID) | ORAL | Status: AC
  Administered 2022-01-07 – 2022-01-08 (×5): 1 mg via ORAL
  Filled 2022-01-07 (×5): qty 1

## 2022-01-07 MED ORDER — LORAZEPAM 1 MG PO TABS
1.0000 mg | ORAL_TABLET | Freq: Three times a day (TID) | ORAL | Status: AC
  Administered 2022-01-09 (×3): 1 mg via ORAL
  Filled 2022-01-07 (×3): qty 1

## 2022-01-07 MED ORDER — ONDANSETRON 4 MG PO TBDP: 4.0000 mg | ORAL_TABLET | Freq: Four times a day (QID) | ORAL | Status: AC | PRN

## 2022-01-07 MED ORDER — ACETAMINOPHEN 325 MG PO TABS: 650.0000 mg | ORAL_TABLET | Freq: Four times a day (QID) | ORAL | Status: DC | PRN

## 2022-01-07 MED ORDER — MAGNESIUM HYDROXIDE 400 MG/5ML PO SUSP: 30.0000 mL | Freq: Every day | ORAL | Status: DC | PRN

## 2022-01-07 MED ORDER — ADULT MULTIVITAMIN W/MINERALS CH
1.0000 | ORAL_TABLET | Freq: Every day | ORAL | Status: DC
  Administered 2022-01-07 – 2022-01-14 (×8): 1 via ORAL
  Filled 2022-01-07 (×8): qty 1

## 2022-01-07 MED ORDER — LEVETIRACETAM 500 MG PO TABS
500.0000 mg | ORAL_TABLET | Freq: Two times a day (BID) | ORAL | Status: DC
  Administered 2022-01-07 – 2022-01-14 (×14): 500 mg via ORAL
  Filled 2022-01-07 (×7): qty 1
  Filled 2022-01-07: qty 14
  Filled 2022-01-07: qty 1
  Filled 2022-01-07: qty 28
  Filled 2022-01-07 (×6): qty 1

## 2022-01-07 MED ORDER — LORAZEPAM 1 MG PO TABS: 1.0000 mg | ORAL_TABLET | Freq: Four times a day (QID) | ORAL | Status: AC | PRN

## 2022-01-07 MED ORDER — LOPERAMIDE HCL 2 MG PO CAPS: 2.0000 mg | ORAL_CAPSULE | ORAL | Status: AC | PRN

## 2022-01-07 MED ORDER — LORAZEPAM 1 MG PO TABS
1.0000 mg | ORAL_TABLET | Freq: Two times a day (BID) | ORAL | Status: AC
  Administered 2022-01-10 (×2): 1 mg via ORAL
  Filled 2022-01-07 (×2): qty 1

## 2022-01-07 MED ORDER — THIAMINE HCL 100 MG/ML IJ SOLN
100.0000 mg | Freq: Once | INTRAMUSCULAR | Status: AC
  Administered 2022-01-07: 100 mg via INTRAMUSCULAR
  Filled 2022-01-07: qty 2

## 2022-01-07 MED ORDER — THIAMINE MONONITRATE 100 MG PO TABS
100.0000 mg | ORAL_TABLET | Freq: Every day | ORAL | Status: DC
  Administered 2022-01-08 – 2022-01-14 (×7): 100 mg via ORAL
  Filled 2022-01-07 (×7): qty 1

## 2022-01-07 MED ORDER — ALUM & MAG HYDROXIDE-SIMETH 200-200-20 MG/5ML PO SUSP: 30.0000 mL | ORAL | Status: DC | PRN

## 2022-01-07 NOTE — ED Provider Notes (Signed)
Facility Based Crisis Admission H&P  Date: 01/07/22 Patient Name: Sean [name] MRN: [id number] Chief Complaint: No chief complaint on file.     Diagnoses:  Final diagnoses:  Alcohol use disorder    HPI: Patient presents voluntarily to [facility] behavioral health for walk-in assessment.  He was seen earlier this date by this Probation officer, discharged with resources.  Patient did not leave the premises.  Awbrey is reassessed, face-to-face, by nurse practitioner.  He is Swahili speaking, Swahili interpreter, Marland Kitchen interpreter [phone], assisted with assessment.  Kittleson is seated in observation area, no apparent distress.  He is alert and oriented, pleasant and cooperative during assessment.  He presents with euthymic mood, congruent affect.  Woerner would like to seek residential substance use treatment.  He was released from jail 2 days ago.  He used alcohol, 2 days ago.  Does not disclose amount of alcohol use.  Prior to being in jail 1 month ago patient typically consumed up to 8, 12 ounce beers each day.  He denies substance use aside from alcohol.  Patient reports he is compliant with Keppra 500 mg twice daily typically.  He missed his medication x2 days.  He reports an unwitnessed seizure earlier today.  Helming is currently homeless in [place], most recently residing at homeless shelter, [facility].  He denies access to weapons.  He is not currently employed.  He endorses average sleep and appetite.  Patient offered support and encouragement.  Reviewed treatment plan to include admit to facility based crisis while awaiting residential substance use treatment.  Discussed medications including restarting home medications as well as lorazepam taper.  Patient offered opportunity to ask questions, reviewed side effects.  He verbalizes understanding and agrees with current plan.  Patient gives verbal consent to speak with social worker, Caroll Rancher, phone number [phone].  Spoke  with Marzetta Board who shares that she has met Constante only once, earlier this week,  as she is a Education officer, museum at the [employer].  States he reports Federici was only discharged from jail after "his bond was waived so that he could go to substance use treatment." Marzetta Board originally transported Pineo to [facility] however he declined treatment at this facility because "he did not like the rules."  He was then transported to [facility] where he was admitted overnight.  Per Apolinar Junes sent Kurka to [facility] behavioral health today "to get his seizure medications restarted." Marzetta Board shares that if patient does not attend residential substance use treatment he could be returned to jail.  She does not disclose any parameters regarding the length of stay required for patient at residential substance use treatment facility.  PHQ 2-9:   [place] ED from 09/18/2021 in [facility]PT ED from 07/16/2021 in [place] ED from 01/05/2021 in [place] No Risk No Risk No Risk        Total Time spent with patient: 30 minutes  Musculoskeletal  Strength & Muscle Tone: within normal limits Gait & Station: normal Patient leans: N/A  Psychiatric Specialty Exam  Presentation General Appearance:  Appropriate for Environment; Casual  Eye Contact: Good  Speech: Clear and Coherent; Normal Rate  Speech Volume: Normal  Handedness: Right   Mood and Affect  Mood: Euthymic  Affect: Appropriate; Congruent   Thought Process  Thought Processes: Coherent; Goal Directed; Linear  Descriptions of Associations:Intact  Orientation:Full (Time, Place and Person)  Thought Content:Logical; WDL  Hallucinations:Hallucinations: None  Ideas of Reference:None  Suicidal Thoughts:Suicidal Thoughts: No  Homicidal Thoughts:Homicidal Thoughts: No   Sensorium   Memory: Immediate Good; Recent Fair  Judgment: Fair  Insight: Present   Executive Functions  Concentration: Good  Attention Span: Good  Recall: Good  Fund of Knowledge: Good  Language: Good   Psychomotor Activity  Psychomotor Activity: Psychomotor Activity: Normal   Assets  Assets: Communication Skills; Desire for Improvement; Intimacy; Leisure Time; Resilience; Social Support   Sleep  Sleep: Sleep: Good   Nutritional Assessment (For OBS and FBC admissions only) Has the patient had a weight loss or gain of 10 pounds or more in the last 3 months?: No Has the patient had a decrease in food intake/or appetite?: No Does the patient have dental problems?: No Does the patient have eating habits or behaviors that may be indicators of an eating disorder including binging or inducing vomiting?: No Has the patient recently lost weight without trying?: 0 Has the patient been eating poorly because of a decreased appetite?: 0 Malnutrition Screening Tool Score: 0    Physical Exam Vitals and nursing note reviewed.  Constitutional:      Appearance: He is well-developed.  HENT:     Head: Normocephalic.     Nose: Nose normal.  Cardiovascular:     Rate and Rhythm: Normal rate.  Pulmonary:     Effort: Pulmonary effort is normal.  Musculoskeletal:        General: Normal range of motion.  Skin:    General: Skin is warm and dry.  Neurological:     Mental Status: He is alert and oriented to person, place, and time.  Psychiatric:        Attention and Perception: Attention and perception normal.        Mood and Affect: Mood and affect normal.        Speech: Speech normal.        Behavior: Behavior normal. Behavior is cooperative.        Thought Content: Thought content normal.        Cognition and Memory: Cognition and memory normal.    Review of Systems  Constitutional: Negative.   HENT: Negative.    Eyes: Negative.   Respiratory: Negative.     Cardiovascular: Negative.   Gastrointestinal: Negative.   Genitourinary: Negative.   Musculoskeletal: Negative.   Skin: Negative.   Neurological: Negative.   Psychiatric/Behavioral:  Positive for substance abuse.     Blood pressure 128/72, pulse 67, temperature 98.3 F (36.8 C), temperature source Oral, resp. rate 20, SpO2 100 %. There is no height or weight on file to calculate BMI.  Past Psychiatric History:  Alcohol use disorder  Is the patient at risk to self? No  Has the patient been a risk to self in the past 6 months? No .    Has the patient been a risk to self within the distant past? No   Is the patient a risk to others? No   Has the patient been a risk to others in the past 6 months? No   Has the patient been a risk to others within the distant past? No   Past Medical History:  Past Medical History:  Diagnosis Date   Alcohol abuse    Seizures ([place])    No past surgical history on file.  Family History: No family history on file.  Social History:  Social History   Socioeconomic History   Marital status: Single    Spouse  name: Not on file   Number of children: Not on file   Years of education: Not on file   Highest education level: Not on file  Occupational History   Not on file  Tobacco Use   Smoking status: Never   Smokeless tobacco: Never  Vaping Use   Vaping Use: Never used  Substance and Sexual Activity   Alcohol use: Yes    Alcohol/week: 3.0 standard drinks of alcohol    Types: 3 Cans of beer per week    Comment: 3 drinks a day.   Drug use: Never   Sexual activity: Not Currently  Other Topics Concern   Not on file  Social History Narrative   Live alone in his apartment.    Social Determinants of Health   Financial Resource Strain: Not on file  Food Insecurity: Not on file  Transportation Needs: Not on file  Physical Activity: Not on file  Stress: Not on file  Social Connections: Not on file  Intimate Partner Violence: Not on file     SDOH:  SDOH Screenings   Depression (PHQ2-9): Low Risk  (12/11/2020)  Tobacco Use: Low Risk  (09/18/2021)    Last Labs:  Admission on 01/07/2022  Component Date Value Ref Range Status   WBC 01/07/2022 4.0  4.0 - 10.5 K/uL Final   RBC 01/07/2022 4.88  4.22 - 5.81 MIL/uL Final   Hemoglobin 01/07/2022 15.4  13.0 - 17.0 g/dL Final   HCT 01/07/2022 41.9  39.0 - 52.0 % Final   MCV 01/07/2022 85.9  80.0 - 100.0 fL Final   MCH 01/07/2022 31.6  26.0 - 34.0 pg Final   MCHC 01/07/2022 36.8 (H)  30.0 - 36.0 g/dL Final   RDW 01/07/2022 12.8  11.5 - 15.5 % Final   Platelets 01/07/2022 217  150 - 400 K/uL Final   nRBC 01/07/2022 0.0  0.0 - 0.2 % Final   Neutrophils Relative % 01/07/2022 50  % Final   Neutro Abs 01/07/2022 2.0  1.7 - 7.7 K/uL Final   Lymphocytes Relative 01/07/2022 39  % Final   Lymphs Abs 01/07/2022 1.6  0.7 - 4.0 K/uL Final   Monocytes Relative 01/07/2022 6  % Final   Monocytes Absolute 01/07/2022 0.2  0.1 - 1.0 K/uL Final   Eosinophils Relative 01/07/2022 5  % Final   Eosinophils Absolute 01/07/2022 0.2  0.0 - 0.5 K/uL Final   Basophils Relative 01/07/2022 0  % Final   Basophils Absolute 01/07/2022 0.0  0.0 - 0.1 K/uL Final   Immature Granulocytes 01/07/2022 0  % Final   Abs Immature Granulocytes 01/07/2022 0.00  0.00 - 0.07 K/uL Final   Performed at [facility], [place] [address], [place], [place] [address]   Hgb A1c MFr Bld 01/07/2022 4.2 (L)  4.8 - 5.6 % Final   Comment: (NOTE) Pre diabetes:          5.7%-6.4%  Diabetes:              >6.4%  Glycemic control for   <7.0% adults with diabetes    Mean Plasma Glucose 01/07/2022 73.84  mg/dL Final   Performed at [facility], [place] [address] [place], [place] [address]  Admission on 01/07/2022, Discharged on 01/07/2022  Component Date Value Ref Range Status   Glucose-Capillary 01/07/2022 124 (H)  70 - 99 mg/dL Final   Glucose reference range applies only to samples taken after fasting for at least 8 hours.  Allergies: Patient has no known allergies.  PTA Medications: (Not in a hospital admission)   Long Term Goals: Improvement in symptoms so as ready for discharge  Short Term Goals: Patient will verbalize feelings in meetings with treatment team members., Patient will attend at least of 50% of the groups daily., Pt will complete the PHQ9 on admission, day 3 and discharge., Patient will participate in completing the [place], Patient will score a low risk of violence for 24 hours prior to discharge, and Patient will take medications as prescribed daily.  Medical Decision Making  Patient reviewed with Dr Hampton Abbot. He remains voluntary. Discussed medical clearance with emergency department provider, Dr Sherry Ruffing, emergency department evaluation not required at this time.   Laboratory studies ordered including CBC, CMP, ethanol, A1c, lipid panel, magnesium, prolactin and TSH.  Urine drug screen order initiated.  EKG ordered.  Current medications: -Acetaminophen 650 mg every 6 as needed/mild pain -Maalox 30 mL oral every 4 as needed/digestion -Magnesium hydroxide 30 mL daily as needed/mild constipation  Restarted home medication: -levetiracetam 571m BID  CIWA Ativan protocol initiated: -Loperamide 2 to 4 mg oral as needed/diarrhea or loose stools -Lorazepam 1 mg 4 times daily x4 doses, 1 mg 3 times daily x3 doses, 1 mg 2 times daily x2 doses, 1 mg daily x1 dose -Lorazepam 1 mg every 6 hours as needed CIWA greater than 10 -Multivitamin with minerals 1 tablet daily -Ondansetron disintegrating tablet 4 mg every 6 as needed/nausea or vomiting -Thiamine injection 100 mg IM once -Thiamine tablet 100 mg daily    Recommendations  Based on my evaluation the patient does not appear to have an emergency medical condition.  TLucky Rathke FNP 01/07/22  7:32 PM

## 2022-01-07 NOTE — ED Notes (Signed)
Pt is in the bed sleeping. Respirations are even and unlabored. No acute distress noted. Will continue to monitor for safety. 

## 2022-01-07 NOTE — Discharge Instructions (Addendum)
Patient is instructed prior to discharge to:  Take all medications as prescribed by his/her mental healthcare provider. Report any adverse effects and or reactions from the medicines to his/her outpatient provider promptly. Keep all scheduled appointments, to ensure that you are getting refills on time and to avoid any interruption in your medication.  If you are unable to keep an appointment call to reschedule.  Be sure to follow-up with resources and follow-up appointments provided.  Patient has been instructed & cautioned: To not engage in alcohol and or illegal drug use while on prescription medicines. In the event of worsening symptoms, patient is instructed to call the crisis hotline, 911 and or go to the nearest ED for appropriate evaluation and treatment of symptoms. To follow-up with his/her primary care provider for your other medical issues, concerns and or health care needs.   Substance Abuse Resources  [facility] - Admissions are currently completed Monday through Friday at [facility]; both appointments and walk-ins are accepted.  Any individual that is a [facility] resident may present for a substance abuse screening and assessment for admission.  A person may be referred by numerous sources or self-refer.   Potential clients will be screened for medical necessity and appropriateness for the program.  Clients must meet criteria for high-intensity residential treatment services.  If clinically appropriate, a client will continue with the comprehensive clinical assessment and intake process, as well as enrollment in the [facility].   Address: [address] [place], [place] [address] Admin Hours: Mon-Fri 8AM to [place] Hours: 24/7 Phone: [phone] Fax: [phone]   [facility]) Facility Based Crisis:  These are 3 locations for services: Please call before arrival    Address: [address] [place], [place] [address] Phone:  [phone]   Address: [address] [place], [place] [address] Phone#: [phone]   Address: [address] [place], [place] [address] Phone#: [phone]     [facility] ([facility]): (offers outpatient therapy and intensive outpatient substance abuse therapy).  [address], [place], [place] [address] Phone: [phone]   [place] [facility]: Offers FREE recovery skills classes, support groups, 1:1 Peer Support, and Compeer Classes. [address] [place], [place] [address] Phone: [phone] (Call to complete intake).  [facility] [address] [place], [place] [address] Phone: [phone] ext: [facility] provides food, shelter and other programs and services to the homeless men of [place] through our [employer].   By offering safe shelter, three meals a day, clean clothing, Biblical counseling, financial planning, vocational training, GED/education and employment assistance, we've helped mend the shattered lives of many homeless men since opening in 1974.   We have approximately 267 beds available, with a max of 312 beds including mats for emergency situations and currently house an average of 270 men a night.   Prospective Client Check-In Information Photo ID Required (State/ Out of State/ [facility]) - if photo ID is not available, clients are required to have a printout of a police/sheriff's criminal history report. Help out with chores around the [place]. No sex offender of any type (pending, charged, registered and/or any other sex related offenses) will be permitted to check in. Must be willing to abide by all rules, regulations, and policies established by the [employer]. The following will be provided - shelter, food, clothing, and biblical counseling. If you or someone  you know is in need of assistance at our [facility] shelter in [place], [place], please call  [phone] ext. [place]:[phone].   [facility]-will provide timely access to mental health services for children and adolescents (4-17) and adults presenting in a mental health crisis. The program is designed for those who need urgent Behavioral Health or Substance Use treatment and are not experiencing a medical crisis that would typically require an emergency room visit.    [place], [place] [address] Phone: [phone] [email or web address]: Phone#: [phone]   The [facility] SA Intensive Outpatient Program (SAIOP) means structured individual and group addiction activities and services that are provided at an outpatient program designed to assist adult and adolescent consumers to begin recovery and learn skills for recovery maintenance. The [facility] program is offered at least 3 hours a day, 3 days a week.SAIOP services shall include a structured program consisting of, but not limited to, the following services: Individual counseling and support; Group counseling and support; Family counseling, training or support; Biochemical assays to identify recent drug use (e.g., urine drug screens); Strategies for relapse prevention to include community and social support systems in treatment; Life skills; Crisis contingency planning; Disease Management; and Treatment support activities that have been adapted or specifically designed for persons with physical disabilities, or persons with co-occurring disorders of mental illness and substance abuse/dependence or mental retardation/developmental disability and substance abuse/dependence. Phone: [place] [address] [place], [place] [address] Phone: [phone] Admissions team is available 24/7  Phone: [phone]. Fax: [phone]   [facility]     Admissions [address] [place], [place]  [address] [phone]    The [place]: [phone]  Behavioral Health Crisis Line: [phone]

## 2022-01-07 NOTE — ED Provider Notes (Addendum)
Behavioral Health Urgent Care Medical Screening Exam  Patient Name: Yakov Bergen MRN: [id number] Date of Evaluation: 01/07/22 Chief Complaint:   Diagnosis:  Final diagnoses:  Homeless  Alcohol use disorder    History of Present illness: Brixon Zhen is a 21 y.o. male. Patient presents voluntarily to [facility] behavioral health for walk-in assessment.   Patient is assessed, face-to-face, by nurse practitioner, seated in assessment area, no acute distress.  He  is alert and oriented, pleasant and cooperative during assessment.   Shi speaks Swahili.  A Swahili language interpreter, [place] interpreter [phone] assisted with this assessment.  Patient states "I came here because I do not have a place to live, I need help finding a place to live."  [facility] requests that I call his outpatient therapist, Marlise Eves phone number [phone].  He last met with Marzetta Board on yesterday and she suggested that [facility] behavioral health could potentially assist him with housing resources.  Chronic stressors include diagnosis of epilepsy.  Patient states "the only problem I have is I have epilepsy and I get seizures."  He has struggled with outpatient primary care follow-up.  He requests primary care provider follow-up resources.  [place] would like to seek residential substance use treatment.  He has upcoming court dates and plans to speak with his attorney to confirm court dates prior to seeking admission at [facility].  He typically consumes up to 8 beers daily, most recent alcohol use 2 days ago.  He recently was discharged from jail after a 1 month stay.  He denies substance use aside from alcohol.  He denies personal history of mental illness.  He denies medications to address mood.  He denies history of inpatient psychiatric hospitalization.  No family mental health history reported.  Recent stressors include homelessness for approximately 1 year.  He has most recently resided at [facility] however would prefer not to return to shelter because "people there have mental health issues, and the violence too much sometimes."  Patient  presents with euthymic mood, congruent affect. he  denies suicidal and homicidal ideations. Denies history of suicide attempts, denies history of nonsuicidal self-harm.  Patient easily  contracts verbally for safety with this Probation officer.    Patient has normal speech and behavior.  he  denies auditory and visual hallucinations.  Patient is able to converse coherently with goal-directed thoughts and no distractibility or preoccupation.  Denies symptoms of paranoia.  Objectively there is no evidence of psychosis/mania or delusional thinking.  Patient homeless in [place], most recently resided at [facility].  He denies access to weapons.  Patient endorses average sleep and appetite.  Patient offered support and encouragement.  Patient shares that he would like a bus pass and he is confident he can navigate the city bus.  Attempted to reach patient's outpatient therapist, Marzetta Board phone number [phone], HIPAA compliant voicemail left.   Patient educated and verbalizes understanding of mental health resources and other crisis services in the community. They are instructed to call 911 and present to the nearest emergency room should patient experience any suicidal/homicidal ideation, auditory/visual/hallucinations, or detrimental worsening of mental health condition.      Psychiatric Specialty Exam  Presentation  General Appearance:Appropriate for Environment; Casual  Eye Contact:Good  Speech:Clear and Coherent; Normal Rate  Speech Volume:Normal  Handedness:Right   Mood and Affect  Mood: Euthymic  Affect: Appropriate; Congruent   Thought Process  Thought Processes: Coherent; Linear; Goal Directed  Descriptions of Associations:Intact  Orientation:Full (Time, Place and  Person)  Thought Content:Logical;  WDL    Hallucinations:None  Ideas of Reference:None  Suicidal Thoughts:No  Homicidal Thoughts:No   Sensorium  Memory: Immediate Good; Recent Fair  Judgment: Fair  Insight: Present   Executive Functions  Concentration: Good  Attention Span: Good  Recall: Good  Fund of Knowledge: Good  Language: Good   Psychomotor Activity  Psychomotor Activity: Normal   Assets  Assets: Communication Skills; Desire for Improvement; Intimacy; Leisure Time; Resilience; Social Support   Sleep  Sleep: Good  Number of hours: No data recorded  No data recorded  Physical Exam: Physical Exam Vitals and nursing note reviewed.  Constitutional:      Appearance: Normal appearance. He is well-developed and normal weight.  HENT:     Head: Normocephalic and atraumatic.     Nose: Nose normal.  Cardiovascular:     Rate and Rhythm: Normal rate.  Pulmonary:     Effort: Pulmonary effort is normal.  Musculoskeletal:        General: Normal range of motion.     Cervical back: Normal range of motion.  Skin:    General: Skin is warm and dry.  Neurological:     Mental Status: He is alert and oriented to person, place, and time.  Psychiatric:        Attention and Perception: Attention and perception normal.        Mood and Affect: Mood and affect normal.        Speech: Speech normal.        Behavior: Behavior normal. Behavior is cooperative.        Thought Content: Thought content normal.        Cognition and Memory: Cognition and memory normal.    Review of Systems  Constitutional: Negative.   HENT: Negative.    Eyes: Negative.   Respiratory: Negative.    Cardiovascular: Negative.   Gastrointestinal: Negative.   Genitourinary: Negative.   Musculoskeletal: Negative.   Skin: Negative.   Neurological: Negative.   Psychiatric/Behavioral:  Positive for substance abuse.    Blood pressure 138/84, pulse 77, temperature 98.4 F (36.9 C), temperature source Oral, resp. rate  18, SpO2 96 %. There is no height or weight on file to calculate BMI.  Musculoskeletal: Strength & Muscle Tone: within normal limits Gait & Station: normal Patient leans: N/A   [facility] Discharge Disposition for Follow up and Recommendations: Based on my evaluation the patient does not appear to have an emergency medical condition and can be discharged with resources and follow up care in outpatient services for Medication Management and Individual Therapy Patient reviewed with Dr. Hampton Abbot. Follow-up with primary care resources provided. Follow-up with housing/interactive resource center resource. Follow-up with substance use treatment resources provided. Follow-up with mental health outpatient resources provided.  Lucky Rathke, FNP 01/07/2022, 2:34 PM

## 2022-01-07 NOTE — ED Notes (Signed)
Pt laying on bed at present,  A&O x 4, no distress noted. Calm & cooperative.  MOnitoring for safety.  Pending FBC.

## 2022-01-08 DIAGNOSIS — F1010 Alcohol abuse, uncomplicated: Secondary | ICD-10-CM | POA: Diagnosis not present

## 2022-01-08 DIAGNOSIS — Z5900 Homelessness unspecified: Secondary | ICD-10-CM | POA: Diagnosis not present

## 2022-01-08 DIAGNOSIS — G40909 Epilepsy, unspecified, not intractable, without status epilepticus: Secondary | ICD-10-CM | POA: Diagnosis not present

## 2022-01-08 DIAGNOSIS — F1090 Alcohol use, unspecified, uncomplicated: Secondary | ICD-10-CM | POA: Diagnosis not present

## 2022-01-08 DIAGNOSIS — Z79899 Other long term (current) drug therapy: Secondary | ICD-10-CM | POA: Diagnosis not present

## 2022-01-08 LAB — POCT URINE DRUG SCREEN - MANUAL ENTRY (I-SCREEN)
POC Amphetamine UR: NOT DETECTED
POC Buprenorphine (BUP): NOT DETECTED
POC Cocaine UR: NOT DETECTED
POC Marijuana UR: NOT DETECTED
POC Methadone UR: NOT DETECTED
POC Methamphetamine UR: NOT DETECTED
POC Morphine: NOT DETECTED
POC Oxazepam (BZO): POSITIVE — AB
POC Oxycodone UR: NOT DETECTED
POC Secobarbital (BAR): NOT DETECTED

## 2022-01-08 MED ORDER — LORAZEPAM 2 MG/ML IJ SOLN: 2.0000 mg | Freq: Once | INTRAMUSCULAR | Status: DC | PRN

## 2022-01-08 NOTE — ED Notes (Signed)
In AA group meeting 

## 2022-01-08 NOTE — ED Provider Notes (Signed)
Behavioral Health Progress Note  Date and Time: 01/08/2022 6:51 PM Name: Sean [name] MRN:  [id number]  Subjective:   Sean [name] is a 21 year old male who speaks primarily Swahili with a past psychiatric history of alcohol use disorder and seizure disorder.  The patient receives frequent care in the emergency department and has had a limited follow-up with outpatient providers.  He is currently on Keppra 500 mg twice daily for seizures.  The patient presented to the [facility] behavioral health urgent care with request for detox and placement in residential rehab.  LCSW from jail Burnetta Sabin, [phone], reports the patient will go back to jail unless he attends residential rehab.  On interview and assessment today, the patient reports that he does not typically experience withdrawal symptoms.  He denies experiencing suicidal thoughts.  He reports some feelings of depression recently.  He exhibits no objective distress and appears comfortable.   Diagnosis:  Final diagnoses:  Alcohol use disorder    Total Time spent with patient: 30 minutes  Past Psychiatric History: as above Past Medical History:  Past Medical History:  Diagnosis Date   Alcohol abuse    Seizures (HCC)    No past surgical history on file. Family History: No family history on file. Family Psychiatric  History: per H and P Social History:  Social History   Substance and Sexual Activity  Alcohol Use Yes   Alcohol/week: 3.0 standard drinks of alcohol   Types: 3 Cans of beer per week   Comment: 3 drinks a day.     Social History   Substance and Sexual Activity  Drug Use Never    Social History   Socioeconomic History   Marital status: Single    Spouse name: Not on file   Number of children: Not on file   Years of education: Not on file   Highest education level: Not on file  Occupational History   Not on file  Tobacco Use   Smoking status: Never   Smokeless tobacco: Never  Vaping Use    Vaping Use: Never used  Substance and Sexual Activity   Alcohol use: Yes    Alcohol/week: 3.0 standard drinks of alcohol    Types: 3 Cans of beer per week    Comment: 3 drinks a day.   Drug use: Never   Sexual activity: Not Currently  Other Topics Concern   Not on file  Social History Narrative   Live alone in his apartment.    Social Determinants of Health   Financial Resource Strain: Not on file  Food Insecurity: Not on file  Transportation Needs: Not on file  Physical Activity: Not on file  Stress: Not on file  Social Connections: Not on file   SDOH:  SDOH Screenings   Depression (PHQ2-9): Low Risk  (12/11/2020)  Tobacco Use: Low Risk  (09/18/2021)   Additional Social History:                         Sleep: Fair  Appetite:  Fair  Current Medications:  Current Facility-Administered Medications  Medication Dose Route Frequency Provider Last Rate Last Admin   acetaminophen (TYLENOL) tablet 650 mg  650 mg Oral Q6H PRN Lenard Lance, FNP       alum & mag hydroxide-simeth (MAALOX/MYLANTA) 200-200-20 MG/5ML suspension 30 mL  30 mL Oral Q4H PRN Lenard Lance, FNP       levETIRAcetam (KEPPRA) tablet 500 mg  500 mg Oral BID  Lucky Rathke, FNP   500 mg at 01/08/22 4098   loperamide (IMODIUM) capsule 2-4 mg  2-4 mg Oral PRN Lucky Rathke, FNP       LORazepam (ATIVAN) injection 2 mg  2 mg Intramuscular Once PRN Corky Sox, MD       LORazepam (ATIVAN) tablet 1 mg  1 mg Oral Q6H PRN Lucky Rathke, FNP       LORazepam (ATIVAN) tablet 1 mg  1 mg Oral QID Lucky Rathke, FNP   1 mg at 01/08/22 1811   Followed by   Derrill Memo ON 01/09/2022] LORazepam (ATIVAN) tablet 1 mg  1 mg Oral TID Lucky Rathke, FNP       Followed by   Derrill Memo ON 01/10/2022] LORazepam (ATIVAN) tablet 1 mg  1 mg Oral BID Lucky Rathke, FNP       Followed by   Derrill Memo ON 01/11/2022] LORazepam (ATIVAN) tablet 1 mg  1 mg Oral Daily Lucky Rathke, FNP       magnesium hydroxide (MILK OF MAGNESIA) suspension 30  mL  30 mL Oral Daily PRN Lucky Rathke, FNP       multivitamin with minerals tablet 1 tablet  1 tablet Oral Daily Lucky Rathke, FNP   1 tablet at 01/08/22 0934   ondansetron (ZOFRAN-ODT) disintegrating tablet 4 mg  4 mg Oral Q6H PRN Lucky Rathke, FNP       thiamine (VITAMIN B1) tablet 100 mg  100 mg Oral Daily Lucky Rathke, FNP   100 mg at 01/08/22 0934   No current outpatient medications on file.    Labs  Lab Results:  Admission on 01/07/2022  Component Date Value Ref Range Status   SARS Coronavirus 2 by RT PCR 01/07/2022 NEGATIVE  NEGATIVE Final   Comment: (NOTE) SARS-CoV-2 target nucleic acids are NOT DETECTED.  The SARS-CoV-2 RNA is generally detectable in upper respiratory specimens during the acute phase of infection. The lowest concentration of SARS-CoV-2 viral copies this assay can detect is 138 copies/mL. A negative result does not preclude SARS-Cov-2 infection and should not be used as the sole basis for treatment or other patient management decisions. A negative result may occur with  improper specimen collection/handling, submission of specimen other than nasopharyngeal swab, presence of viral mutation(s) within the areas targeted by this assay, and inadequate number of viral copies(<138 copies/mL). A negative result must be combined with clinical observations, patient history, and epidemiological information. The expected result is Negative.  Fact Sheet for Patients:  EntrepreneurPulse.com.au  Fact Sheet for Healthcare Providers:  IncredibleEmployment.be  This test is no                          t yet approved or cleared by the [place] FDA and  has been authorized for detection and/or diagnosis of SARS-CoV-2 by FDA under an Emergency Use Authorization (EUA). This EUA will remain  in effect (meaning this test can be used) for the duration of the COVID-19 declaration under Section 564(b)(1) of the Act, 21 U.S.C.section  360bbb-3(b)(1), unless the authorization is terminated  or revoked sooner.       Influenza A by PCR 01/07/2022 NEGATIVE  NEGATIVE Final   Influenza B by PCR 01/07/2022 NEGATIVE  NEGATIVE Final   Comment: (NOTE) The Xpert Xpress SARS-CoV-2/FLU/RSV plus assay is intended as an aid in the diagnosis of influenza from Nasopharyngeal swab specimens and should not be used as a  sole basis for treatment. Nasal washings and aspirates are unacceptable for Xpert Xpress SARS-CoV-2/FLU/RSV testing.  Fact Sheet for Patients: BloggerCourse.com  Fact Sheet for Healthcare Providers: SeriousBroker.it  This test is not yet approved or cleared by the [place] FDA and has been authorized for detection and/or diagnosis of SARS-CoV-2 by FDA under an Emergency Use Authorization (EUA). This EUA will remain in effect (meaning this test can be used) for the duration of the COVID-19 declaration under Section 564(b)(1) of the Act, 21 U.S.C. section 360bbb-3(b)(1), unless the authorization is terminated or revoked.  Performed at [facility], [address], [place], [place] [address]    WBC 01/07/2022 4.0  4.0 - 10.5 K/uL Final   RBC 01/07/2022 4.88  4.22 - 5.81 MIL/uL Final   Hemoglobin 01/07/2022 15.4  13.0 - 17.0 g/dL Final   HCT 15/17/6160 41.9  39.0 - 52.0 % Final   MCV 01/07/2022 85.9  80.0 - 100.0 fL Final   MCH 01/07/2022 31.6  26.0 - 34.0 pg Final   MCHC 01/07/2022 36.8 (H)  30.0 - 36.0 g/dL Final   RDW 73/71/0626 12.8  11.5 - 15.5 % Final   Platelets 01/07/2022 217  150 - 400 K/uL Final   nRBC 01/07/2022 0.0  0.0 - 0.2 % Final   Neutrophils Relative % 01/07/2022 50  % Final   Neutro Abs 01/07/2022 2.0  1.7 - 7.7 K/uL Final   Lymphocytes Relative 01/07/2022 39  % Final   Lymphs Abs 01/07/2022 1.6  0.7 - 4.0 K/uL Final   Monocytes Relative 01/07/2022 6  % Final   Monocytes Absolute 01/07/2022 0.2  0.1 - 1.0 K/uL Final   Eosinophils  Relative 01/07/2022 5  % Final   Eosinophils Absolute 01/07/2022 0.2  0.0 - 0.5 K/uL Final   Basophils Relative 01/07/2022 0  % Final   Basophils Absolute 01/07/2022 0.0  0.0 - 0.1 K/uL Final   Immature Granulocytes 01/07/2022 0  % Final   Abs Immature Granulocytes 01/07/2022 0.00  0.00 - 0.07 K/uL Final   Performed at [facility], [address], [place], [place] [address]   Sodium 01/07/2022 136  135 - 145 mmol/L Final   Potassium 01/07/2022 3.5  3.5 - 5.1 mmol/L Final   Chloride 01/07/2022 100  98 - 111 mmol/L Final   CO2 01/07/2022 28  22 - 32 mmol/L Final   Glucose, Bld 01/07/2022 140 (H)  70 - 99 mg/dL Final   Glucose reference range applies only to samples taken after fasting for at least 8 hours.   BUN 01/07/2022 12  6 - 20 mg/dL Final   Creatinine, Ser 01/07/2022 0.75  0.61 - 1.24 mg/dL Final   Calcium 62/70/3500 9.8  8.9 - 10.3 mg/dL Final   Total Protein 93/81/8299 8.1  6.5 - 8.1 g/dL Final   Albumin 37/16/9678 4.7  3.5 - 5.0 g/dL Final   AST 93/81/0175 36  15 - 41 U/L Final   ALT 01/07/2022 53 (H)  0 - 44 U/L Final   Alkaline Phosphatase 01/07/2022 97  38 - 126 U/L Final   Total Bilirubin 01/07/2022 1.2  0.3 - 1.2 mg/dL Final   GFR, Estimated 01/07/2022 >60  >60 mL/min Final   Comment: (NOTE) Calculated using the CKD-EPI Creatinine Equation (2021)    Anion gap 01/07/2022 8  5 - 15 Final   Performed at [facility], [address], [place], [place] [address]   Hgb A1c MFr Bld 01/07/2022 4.2 (  L)  4.8 - 5.6 % Final   Comment: (NOTE) Pre diabetes:          5.7%-6.4%  Diabetes:              >6.4%  Glycemic control for   <7.0% adults with diabetes    Mean Plasma Glucose 01/07/2022 73.84  mg/dL Final   Performed at [facility], [address] [place], [place] [address]   Magnesium 01/07/2022 2.0  1.7 - 2.4 mg/dL Final   Performed at [facility] [place] [facility], [address] [place], [place] [address]   Alcohol, Ethyl (B) 01/07/2022 <10  <10 mg/dL Final    Comment: (NOTE) Lowest detectable limit for serum alcohol is 10 mg/dL.  For medical purposes only. Performed at [facility], [address] [place], [place] [address]    Cholesterol 01/07/2022 201 (H)  0 - 200 mg/dL Final   Triglycerides 67/89/3810 137  <150 mg/dL Final   HDL 17/51/0258 47  >40 mg/dL Final   Total CHOL/HDL Ratio 01/07/2022 4.3  RATIO Final   VLDL 01/07/2022 27  0 - 40 mg/dL Final   LDL Cholesterol 01/07/2022 127 (H)  0 - 99 mg/dL Final   Comment:        Total Cholesterol/HDL:CHD Risk Coronary Heart Disease Risk Table                     Men   Women  1/2 Average Risk   3.4   3.3  Average Risk       5.0   4.4  2 X Average Risk   9.6   7.1  3 X Average Risk  23.4   11.0        Use the calculated Patient Ratio above and the CHD Risk Table to determine the patient's CHD Risk.        ATP III CLASSIFICATION (LDL):  <100     mg/dL   Optimal  527-782  mg/dL   Near or Above                    Optimal  130-159  mg/dL   Borderline  423-536  mg/dL   High  >144     mg/dL   Very High Performed at [facility], [address], [place], [place] [address]    TSH 01/07/2022 1.084  0.350 - 4.500 uIU/mL Final   Comment: Performed by a 3rd Generation assay with a functional sensitivity of <=0.01 uIU/mL. Performed at [facility], [address], [place], [place] [address]    POC Amphetamine UR 01/08/2022 None Detected  NONE DETECTED (Cut Off Level 1000 ng/mL) Final   POC Secobarbital (BAR) 01/08/2022 None Detected  NONE DETECTED (Cut Off Level 300 ng/mL) Final   POC Buprenorphine (BUP) 01/08/2022 None Detected  NONE DETECTED (Cut Off Level 10 ng/mL) Final   POC Oxazepam (BZO) 01/08/2022 Positive (A)  NONE DETECTED (Cut Off Level 300 ng/mL) Final   POC Cocaine UR 01/08/2022 None Detected  NONE DETECTED (Cut Off Level 300 ng/mL) Final   POC Methamphetamine UR 01/08/2022 None Detected  NONE DETECTED (Cut Off Level 1000 ng/mL) Final   POC Morphine 01/08/2022  None Detected  NONE DETECTED (Cut Off Level 300 ng/mL) Final   POC Methadone UR 01/08/2022 None Detected  NONE DETECTED (Cut Off Level 300 ng/mL) Final   POC Oxycodone UR 01/08/2022 None Detected  NONE DETECTED (Cut Off Level 100  ng/mL) Final   POC Marijuana UR 01/08/2022 None Detected  NONE DETECTED (Cut Off Level 50 ng/mL) Final  Admission on 01/07/2022, Discharged on 01/07/2022  Component Date Value Ref Range Status   Glucose-Capillary 01/07/2022 124 (H)  70 - 99 mg/dL Final   Glucose reference range applies only to samples taken after fasting for at least 8 hours.    Blood Alcohol level:  Lab Results  Component Value Date   ETH <10 01/07/2022   ETH 418 (HH) 01/04/2021    Metabolic Disorder Labs: Lab Results  Component Value Date   HGBA1C 4.2 (L) 01/07/2022   MPG 73.84 01/07/2022   No results found for: "PROLACTIN" Lab Results  Component Value Date   CHOL 201 (H) 01/07/2022   TRIG 137 01/07/2022   HDL 47 01/07/2022   CHOLHDL 4.3 01/07/2022   VLDL 27 01/07/2022   LDLCALC 127 (H) 01/07/2022   LDLCALC 110 (H) 12/11/2020    Therapeutic Lab Levels: No results found for: "LITHIUM" No results found for: "VALPROATE" No results found for: "CBMZ"  Physical Findings   PHQ2-9    Flowsheet Row Office Visit from 12/11/2020 in [facility]  PHQ-2 Total Score 0      Flowsheet Row [facility]  C-SSRS RISK CATEGORY No Risk No Risk No Risk        Musculoskeletal  Strength & Muscle Tone: within normal limits Gait & Station: normal Patient leans: N/A  Psychiatric Specialty Exam  Presentation  General Appearance:  Appropriate for Environment; Casual  Eye Contact: Good  Speech: Clear and Coherent; Normal Rate  Speech  Volume: Normal  Handedness: Right   Mood and Affect  Mood: Euthymic  Affect: Appropriate; Congruent   Thought Process  Thought Processes: Coherent; Goal Directed; Linear  Descriptions of Associations:Intact  Orientation:Full (Time, Place and Person)  Thought Content:Logical; WDL     Hallucinations:Hallucinations: None  Ideas of Reference:None  Suicidal Thoughts:Suicidal Thoughts: No  Homicidal Thoughts:Homicidal Thoughts: No   Sensorium  Memory: Immediate Good; Recent Fair  Judgment: Fair  Insight: Present   Executive Functions  Concentration: Good  Attention Span: Good  Recall: Good  Fund of Knowledge: Good  Language: Good   Psychomotor Activity  Psychomotor Activity: Psychomotor Activity: Normal   Assets  Assets: Communication Skills; Desire for Improvement; Intimacy; Leisure Time; Resilience; Social Support   Sleep  Sleep: Sleep: Good   Nutritional Assessment (For OBS and FBC admissions only) Has the patient had a weight loss or gain of 10 pounds or more in the last 3 months?: No Has the patient had a decrease in food intake/or appetite?: No Does the patient have dental problems?: No Does the patient have eating habits or behaviors that may be indicators of an eating disorder including binging or inducing vomiting?: No Has the patient recently lost weight without trying?: 0 Has the patient been eating poorly because of a decreased appetite?: 0 Malnutrition Screening Tool Score: 0    Physical Exam  Physical Exam Constitutional:      Appearance: the patient is not toxic-appearing.  Pulmonary:     Effort: Pulmonary effort is normal.  Neurological:     General: No focal deficit present.     Mental Status: the patient is alert and oriented to person, place, and time.   Review of Systems  Respiratory:  Negative for  shortness of breath.   Cardiovascular:  Negative for chest pain.  Gastrointestinal:  Negative for  abdominal pain, constipation, diarrhea, nausea and vomiting.  Neurological:  Negative for headaches.   Blood pressure 131/80, pulse 92, temperature 98.8 F (37.1 C), temperature source Oral, resp. rate 18, SpO2 100 %. There is no height or weight on file to calculate BMI.  Treatment Plan Summary: Daily contact with patient to assess and evaluate symptoms and progress in treatment and Medication management  Status: Voluntary  Continue home Keppra 500 mg twice daily  Lab work unremarkable  Disposition: LCSW to attempt to place the patient at [facility] residential rehab.  Difficulties may be encountered given that the patient is ESL and has a history of seizure disorder.  Caring services can be tried again.   Carlyn Reichert, MD 01/08/2022 6:51 PM

## 2022-01-08 NOTE — ED Notes (Signed)
Pt is A &O X4. Pt is in the day room at present watching TV. Pt is calm and cooperative.

## 2022-01-08 NOTE — ED Notes (Signed)
Pt admitted to [facility]  requesting for residential substance use treatment. Patient was cooperative during the admission assessment. Skin assessment complete. Belongings in the [facility]. Patient oriented to unit and unit rules. Meal and drinks offered to patient.  Patient verbalized agreement to treatment plans. Patient verbally contracts for safety while hospitalized. Will monitor for safety.

## 2022-01-08 NOTE — ED Notes (Signed)
Pt is in the bed sleeping. Respirations are even and unlabored. No acute distress noted. Will continue to monitor for safety. 

## 2022-01-08 NOTE — ED Notes (Signed)
Sean [name] presents calm and cooperative during morning assessment. Pt expressed disbelief about his current time in jail and his recent release. Denies anxiety and depression. Denies auditory and visual hallucinations. Denies suicidal and homicidal thoughts. Med compliant. Attended and participated in safety plan group. Ate breakfast and lunch, but states that the "food is not good and it is hard for me to eat."  Patient up in dining area watching TV. No signs of aggression observed or reported. Staff will continue to monitor for safety q15 min.

## 2022-01-08 NOTE — ED Notes (Signed)
Patient in dayroom watching TV. Patient in bed resting some throughout the afternoon. Denies any concerns at this time. Med complaint. Patient ate dinner. No acute safety concerns at this time.

## 2022-01-09 ENCOUNTER — Encounter (HOSPITAL_COMMUNITY): Payer: Self-pay

## 2022-01-09 DIAGNOSIS — Z79899 Other long term (current) drug therapy: Secondary | ICD-10-CM | POA: Diagnosis not present

## 2022-01-09 DIAGNOSIS — Z5900 Homelessness unspecified: Secondary | ICD-10-CM | POA: Diagnosis not present

## 2022-01-09 DIAGNOSIS — F1010 Alcohol abuse, uncomplicated: Secondary | ICD-10-CM | POA: Diagnosis not present

## 2022-01-09 DIAGNOSIS — F1090 Alcohol use, unspecified, uncomplicated: Secondary | ICD-10-CM | POA: Diagnosis not present

## 2022-01-09 DIAGNOSIS — G40909 Epilepsy, unspecified, not intractable, without status epilepticus: Secondary | ICD-10-CM | POA: Diagnosis not present

## 2022-01-09 LAB — PROLACTIN: Prolactin: 7.8 ng/mL (ref 4.0–15.2)

## 2022-01-09 NOTE — ED Notes (Signed)
Pt resting in bed. A&O x4, calm and cooperative. Denies current SI/HI/AVH. No signs of distress noted. Monitoring for safety. 

## 2022-01-09 NOTE — ED Provider Notes (Signed)
Behavioral Health Progress Note  Date and Time: 01/09/2022 3:14 PM Name: Sean [name] MRN:  [id number]  Subjective:   Sean [name] is a 21 year old male with a past psychiatric history of alcohol use disorder and seizure disorder.  The patient receives frequent care in the emergency department and has had a limited follow-up with outpatient providers.  He is currently on Keppra 500 mg twice daily for seizures.  The patient presented to the [facility] behavioral health urgent care with request for detox and placement in residential rehab.  LCSW from jail Caroll Rancher, [phone], reports the patient will go back to jail unless he attends residential rehab.  On interview and assessment today, the patient is observed to be in no acute distress.  He is calm and able to engage in interview.  The patient is asked about the circumstances that led him to be incarcerated.  The patient reports "I was just in [place] and they called the police".  The patient verbalizes understanding that he does not participate in treatment he will likely be brought back to jail.  Patient denies experiencing any withdrawal symptoms today.  He denies experiencing suicidal thoughts or homicidal thoughts.  He denies experiencing auditory or visual hallucinations.  Attempted to call the patient's LCSW at the number listed above.  No answer and voicemail box full.  Attempted again later, no response.  This provider was hoping to clarify disposition plans with the patient, primarily what to do if the patient demands to be discharged.  He has not developed any acute safety concerns since being admitted.  Consulted with Dr. Dwyane Dee who recommends the patient be discharged if he requests and that the sheriff's office be contacted on Monday.  Diagnosis:  Final diagnoses:  Alcohol use disorder    Total Time spent with patient: 30 minutes  Past Psychiatric History: as above Past Medical History:  Past Medical History:   Diagnosis Date   Alcohol abuse    Seizures ([place])    No past surgical history on file. Family History: No family history on file. Family Psychiatric  History: per H and P Social History:  Social History   Substance and Sexual Activity  Alcohol Use Yes   Alcohol/week: 3.0 standard drinks of alcohol   Types: 3 Cans of beer per week   Comment: 3 drinks a day.     Social History   Substance and Sexual Activity  Drug Use Never    Social History   Socioeconomic History   Marital status: Single    Spouse name: Not on file   Number of children: Not on file   Years of education: Not on file   Highest education level: Not on file  Occupational History   Not on file  Tobacco Use   Smoking status: Never   Smokeless tobacco: Never  Vaping Use   Vaping Use: Never used  Substance and Sexual Activity   Alcohol use: Yes    Alcohol/week: 3.0 standard drinks of alcohol    Types: 3 Cans of beer per week    Comment: 3 drinks a day.   Drug use: Never   Sexual activity: Not Currently  Other Topics Concern   Not on file  Social History Narrative   Live alone in his apartment.    Social Determinants of Health   Financial Resource Strain: Not on file  Food Insecurity: Not on file  Transportation Needs: Not on file  Physical Activity: Not on file  Stress: Not on file  Social Connections: Not on file   SDOH:  SDOH Screenings   Depression (PHQ2-9): Low Risk  (12/11/2020)  Tobacco Use: Low Risk  (09/18/2021)   Additional Social History:                         Sleep: Fair  Appetite:  Fair  Current Medications:  Current Facility-Administered Medications  Medication Dose Route Frequency Provider Last Rate Last Admin   acetaminophen (TYLENOL) tablet 650 mg  650 mg Oral Q6H PRN Lenard Lance, FNP       alum & mag hydroxide-simeth (MAALOX/MYLANTA) 200-200-20 MG/5ML suspension 30 mL  30 mL Oral Q4H PRN Lenard Lance, FNP       levETIRAcetam (KEPPRA) tablet 500 mg  500  mg Oral BID Lenard Lance, FNP   500 mg at 01/09/22 2130   loperamide (IMODIUM) capsule 2-4 mg  2-4 mg Oral PRN Lenard Lance, FNP       LORazepam (ATIVAN) injection 2 mg  2 mg Intramuscular Once PRN Carlyn Reichert, MD       LORazepam (ATIVAN) tablet 1 mg  1 mg Oral Q6H PRN Lenard Lance, FNP       LORazepam (ATIVAN) tablet 1 mg  1 mg Oral TID Lenard Lance, FNP   1 mg at 01/09/22 8657   Followed by   Melene Muller ON 01/10/2022] LORazepam (ATIVAN) tablet 1 mg  1 mg Oral BID Lenard Lance, FNP       Followed by   Melene Muller ON 01/11/2022] LORazepam (ATIVAN) tablet 1 mg  1 mg Oral Daily Lenard Lance, FNP       magnesium hydroxide (MILK OF MAGNESIA) suspension 30 mL  30 mL Oral Daily PRN Lenard Lance, FNP       multivitamin with minerals tablet 1 tablet  1 tablet Oral Daily Lenard Lance, FNP   1 tablet at 01/09/22 0925   ondansetron (ZOFRAN-ODT) disintegrating tablet 4 mg  4 mg Oral Q6H PRN Lenard Lance, FNP       thiamine (VITAMIN B1) tablet 100 mg  100 mg Oral Daily Lenard Lance, FNP   100 mg at 01/09/22 8469   No current outpatient medications on file.    Labs  Lab Results:  Admission on 01/07/2022  Component Date Value Ref Range Status   SARS Coronavirus 2 by RT PCR 01/07/2022 NEGATIVE  NEGATIVE Final   Comment: (NOTE) SARS-CoV-2 target nucleic acids are NOT DETECTED.  The SARS-CoV-2 RNA is generally detectable in upper respiratory specimens during the acute phase of infection. The lowest concentration of SARS-CoV-2 viral copies this assay can detect is 138 copies/mL. A negative result does not preclude SARS-Cov-2 infection and should not be used as the sole basis for treatment or other patient management decisions. A negative result may occur with  improper specimen collection/handling, submission of specimen other than nasopharyngeal swab, presence of viral mutation(s) within the areas targeted by this assay, and inadequate number of viral copies(<138 copies/mL). A negative result  must be combined with clinical observations, patient history, and epidemiological information. The expected result is Negative.  Fact Sheet for Patients:  BloggerCourse.com  Fact Sheet for Healthcare Providers:  SeriousBroker.it  This test is no                          t yet approved or cleared by the [place] FDA and  has been  authorized for detection and/or diagnosis of SARS-CoV-2 by FDA under an Emergency Use Authorization (EUA). This EUA will remain  in effect (meaning this test can be used) for the duration of the COVID-19 declaration under Section 564(b)(1) of the Act, 21 U.S.C.section 360bbb-3(b)(1), unless the authorization is terminated  or revoked sooner.       Influenza A by PCR 01/07/2022 NEGATIVE  NEGATIVE Final   Influenza B by PCR 01/07/2022 NEGATIVE  NEGATIVE Final   Comment: (NOTE) The Xpert Xpress SARS-CoV-2/FLU/RSV plus assay is intended as an aid in the diagnosis of influenza from Nasopharyngeal swab specimens and should not be used as a sole basis for treatment. Nasal washings and aspirates are unacceptable for Xpert Xpress SARS-CoV-2/FLU/RSV testing.  Fact Sheet for Patients: BloggerCourse.com  Fact Sheet for Healthcare Providers: SeriousBroker.it  This test is not yet approved or cleared by the [place] FDA and has been authorized for detection and/or diagnosis of SARS-CoV-2 by FDA under an Emergency Use Authorization (EUA). This EUA will remain in effect (meaning this test can be used) for the duration of the COVID-19 declaration under Section 564(b)(1) of the Act, 21 U.S.C. section 360bbb-3(b)(1), unless the authorization is terminated or revoked.  Performed at [facility], [address] [place], [place] [address]    WBC 01/07/2022 4.0  4.0 - 10.5 K/uL Final   RBC 01/07/2022 4.88  4.22 - 5.81 MIL/uL Final   Hemoglobin  01/07/2022 15.4  13.0 - 17.0 g/dL Final   HCT 60/45/4098 41.9  39.0 - 52.0 % Final   MCV 01/07/2022 85.9  80.0 - 100.0 fL Final   MCH 01/07/2022 31.6  26.0 - 34.0 pg Final   MCHC 01/07/2022 36.8 (H)  30.0 - 36.0 g/dL Final   RDW 11/91/4782 12.8  11.5 - 15.5 % Final   Platelets 01/07/2022 217  150 - 400 K/uL Final   nRBC 01/07/2022 0.0  0.0 - 0.2 % Final   Neutrophils Relative % 01/07/2022 50  % Final   Neutro Abs 01/07/2022 2.0  1.7 - 7.7 K/uL Final   Lymphocytes Relative 01/07/2022 39  % Final   Lymphs Abs 01/07/2022 1.6  0.7 - 4.0 K/uL Final   Monocytes Relative 01/07/2022 6  % Final   Monocytes Absolute 01/07/2022 0.2  0.1 - 1.0 K/uL Final   Eosinophils Relative 01/07/2022 5  % Final   Eosinophils Absolute 01/07/2022 0.2  0.0 - 0.5 K/uL Final   Basophils Relative 01/07/2022 0  % Final   Basophils Absolute 01/07/2022 0.0  0.0 - 0.1 K/uL Final   Immature Granulocytes 01/07/2022 0  % Final   Abs Immature Granulocytes 01/07/2022 0.00  0.00 - 0.07 K/uL Final   Performed at [facility], [address], [place], [place] [address]   Sodium 01/07/2022 136  135 - 145 mmol/L Final   Potassium 01/07/2022 3.5  3.5 - 5.1 mmol/L Final   Chloride 01/07/2022 100  98 - 111 mmol/L Final   CO2 01/07/2022 28  22 - 32 mmol/L Final   Glucose, Bld 01/07/2022 140 (H)  70 - 99 mg/dL Final   Glucose reference range applies only to samples taken after fasting for at least 8 hours.   BUN 01/07/2022 12  6 - 20 mg/dL Final   Creatinine, Ser 01/07/2022 0.75  0.61 - 1.24 mg/dL Final   Calcium 30/86/5784 9.8  8.9 - 10.3 mg/dL Final   Total Protein 69/62/9528 8.1  6.5 - 8.1 g/dL Final   Albumin 41/32/4401  4.7  3.5 - 5.0 g/dL Final   AST 78/46/962910/01/2022 36  15 - 41 U/L Final   ALT 01/07/2022 53 (H)  0 - 44 U/L Final   Alkaline Phosphatase 01/07/2022 97  38 - 126 U/L Final   Total Bilirubin 01/07/2022 1.2  0.3 - 1.2 mg/dL Final   GFR, Estimated 01/07/2022 >60  >60 mL/min Final   Comment: (NOTE) Calculated  using the CKD-EPI Creatinine Equation (2021)    Anion gap 01/07/2022 8  5 - 15 Final   Performed at [facility], [address] [place], [place] [address]   Hgb A1c MFr Bld 01/07/2022 4.2 (L)  4.8 - 5.6 % Final   Comment: (NOTE) Pre diabetes:          5.7%-6.4%  Diabetes:              >6.4%  Glycemic control for   <7.0% adults with diabetes    Mean Plasma Glucose 01/07/2022 73.84  mg/dL Final   Performed at [facility], [address] [place], [place] [address]   Magnesium 01/07/2022 2.0  1.7 - 2.4 mg/dL Final   Performed at [facility], [address] [place], [place] [address]   Alcohol, Ethyl (B) 01/07/2022 <10  <10 mg/dL Final   Comment: (NOTE) Lowest detectable limit for serum alcohol is 10 mg/dL.  For medical purposes only. Performed at [facility], [address] [place], [place] [address]    Cholesterol 01/07/2022 201 (H)  0 - 200 mg/dL Final   Triglycerides 40/34/742510/01/2022 137  <150 mg/dL Final   HDL 95/63/875610/01/2022 47  >40 mg/dL Final   Total CHOL/HDL Ratio 01/07/2022 4.3  RATIO Final   VLDL 01/07/2022 27  0 - 40 mg/dL Final   LDL Cholesterol 01/07/2022 127 (H)  0 - 99 mg/dL Final   Comment:        Total Cholesterol/HDL:CHD Risk Coronary Heart Disease Risk Table                     Men   Women  1/2 Average Risk   3.4   3.3  Average Risk       5.0   4.4  2 X Average Risk   9.6   7.1  3 X Average Risk  23.4   11.0        Use the calculated Patient Ratio above and the CHD Risk Table to determine the patient's CHD Risk.        ATP III CLASSIFICATION (LDL):  <100     mg/dL   Optimal  [id number]  mg/dL   Near or Above                    Optimal  130-159  mg/dL   Borderline  [id number]  mg/dL   High  >606>190     mg/dL   Very High Performed at [facility], [address] [place], [place] [address]    TSH 01/07/2022 1.084  0.350 - 4.500 uIU/mL Final   Comment: Performed by a 3rd Generation assay with a functional sensitivity of <=0.01  uIU/mL. Performed at [facility], [address] [place], [place] [address]    Prolactin 01/07/2022 7.8  4.0 - 15.2 ng/mL Final   Comment: (NOTE) Performed At: [facility] [address] [place], [place] [id number] Jolene SchimkeNagendra Sanjai MD RK:[id number]:[phone]    POC Amphetamine UR 01/08/2022 None Detected  NONE DETECTED (Cut Off Level 1000 ng/mL) Final   POC Secobarbital (BAR) 01/08/2022 None Detected  NONE DETECTED (Cut Off Level 300 ng/mL) Final   POC Buprenorphine (BUP) 01/08/2022 None Detected  NONE DETECTED (Cut Off Level 10 ng/mL) Final   POC Oxazepam (BZO) 01/08/2022 Positive (A)  NONE DETECTED (Cut Off Level 300 ng/mL) Final   POC Cocaine UR 01/08/2022 None Detected  NONE DETECTED (Cut Off Level 300 ng/mL) Final   POC Methamphetamine UR 01/08/2022 None Detected  NONE DETECTED (Cut Off Level 1000 ng/mL) Final   POC Morphine 01/08/2022 None Detected  NONE DETECTED (Cut Off Level 300 ng/mL) Final   POC Methadone UR 01/08/2022 None Detected  NONE DETECTED (Cut Off Level 300 ng/mL) Final   POC Oxycodone UR 01/08/2022 None Detected  NONE DETECTED (Cut Off Level 100 ng/mL) Final   POC Marijuana UR 01/08/2022 None Detected  NONE DETECTED (Cut Off Level 50 ng/mL) Final  Admission on 01/07/2022, Discharged on 01/07/2022  Component Date Value Ref Range Status   Glucose-Capillary 01/07/2022 124 (H)  70 - 99 mg/dL Final   Glucose reference range applies only to samples taken after fasting for at least 8 hours.    Blood Alcohol level:  Lab Results  Component Value Date   ETH <10 01/07/2022   ETH 418 (HH) 01/04/2021    Metabolic Disorder Labs: Lab Results  Component Value Date   HGBA1C 4.2 (L) 01/07/2022   MPG 73.84 01/07/2022   Lab Results  Component Value Date   PROLACTIN 7.8 01/07/2022   Lab Results  Component Value Date   CHOL 201 (H) 01/07/2022   TRIG 137 01/07/2022   HDL 47 01/07/2022   CHOLHDL 4.3 01/07/2022   VLDL 27 01/07/2022   LDLCALC 127 (H) 01/07/2022    LDLCALC 110 (H) 12/11/2020    Therapeutic Lab Levels: No results found for: "LITHIUM" No results found for: "VALPROATE" No results found for: "CBMZ"  Physical Findings   PHQ2-9    Flowsheet Row Office Visit from 12/11/2020 in [facility]  PHQ-2 Total Score 0      Flowsheet Row ED from 01/07/2022 in [facility] ED from 09/18/2021 in [facility] from 07/16/2021 in [facility] EMERGENCY DEPARTMENT  C-SSRS RISK CATEGORY No Risk No Risk No Risk        Musculoskeletal  Strength & Muscle Tone: within normal limits Gait & Station: normal Patient leans: N/A  Psychiatric Specialty Exam  Presentation  General Appearance:  Appropriate for Environment; Casual  Eye Contact: Good  Speech: Clear and Coherent; Normal Rate  Speech Volume: Normal  Handedness: Right   Mood and Affect  Mood: Euthymic  Affect: Appropriate; Congruent   Thought Process  Thought Processes: Coherent; Goal Directed; Linear  Descriptions of Associations:Intact  Orientation:Full (Time, Place and Person)  Thought Content:Logical; WDL     Hallucinations:No data recorded  Ideas of Reference:None  Suicidal Thoughts:No data recorded  Homicidal Thoughts:No data recorded   Sensorium  Memory: Immediate Good; Recent Fair  Judgment: Fair  Insight: Present   Executive Functions  Concentration: Good  Attention Span: Good  Recall: Good  Fund of Knowledge: Good  Language: Good   Psychomotor Activity  Psychomotor Activity: No data recorded   Assets  Assets: Communication Skills; Desire for Improvement; Intimacy; Leisure Time; Resilience; Social Support   Sleep  Sleep: No data recorded   No data recorded   Physical Exam  Physical Exam Constitutional:  Appearance: the patient is not toxic-appearing.  Pulmonary:     Effort: Pulmonary effort is normal.   Neurological:     General: No focal deficit present.     Mental Status: the patient is alert and oriented to person, place, and time.   Review of Systems  Respiratory:  Negative for shortness of breath.   Cardiovascular:  Negative for chest pain.  Gastrointestinal:  Negative for abdominal pain, constipation, diarrhea, nausea and vomiting.  Neurological:  Negative for headaches.   Blood pressure (!) 129/90, pulse 100, temperature 98.2 F (36.8 C), temperature source Oral, resp. rate 18, SpO2 100 %. There is no height or weight on file to calculate BMI.  Treatment Plan Summary: Daily contact with patient to assess and evaluate symptoms and progress in treatment and Medication management  Status: Voluntary  Continue home Keppra 500 mg twice daily and Ativan taper  Lab work unremarkable  Disposition: Referral placed for [facility] residential rehab  Attempted to call the patient's LCSW at the number listed above.  No answer and voicemail box full.  Attempted again later, no response.  This provider was hoping to clarify disposition plans with the patient, primarily what to do if the patient demands to be discharged.  He has not developed any acute safety concerns since being admitted.  Consulted with Dr. Lucianne Muss who recommends the patient be discharged if he requests and that the sheriff's office be contacted on Monday.  Carlyn Reichert, MD 01/09/2022 3:14 PM

## 2022-01-09 NOTE — ED Notes (Signed)
Patient attending AAA meetings.

## 2022-01-09 NOTE — ED Notes (Addendum)
Patient aroused from sleep without difficulty with minimal verbal stimulation. Alert oriented x 4. Patient verbalizes no complaints at this time. Affect flat and eye contact is evasive. Voice low and soft. Denies S/I & H/I. Appetite good. Denies A/V/H. Will continue to monitor and support.

## 2022-01-09 NOTE — Tx Team (Signed)
Treatment Team Meeting   LCSW met with pt separately to complete the CCA and determine his needs and goals while at [facility] and after discharge.  Pt expressed a desire to work and go into a residential treatment facility. Pt stated that he is here to get stabilized on his seizure medication so he can get accepted into [facility].  However, pt reported to another provider that he had a seizure prior to coming to [facility].  A referral had been made to [facility], [phone] @ [facility].  Pt was denied admission because of his recent seizure.  At this time, pt will remain over the weekend until placement can be sought after on 12 January 2022.   Sean [name], MSW, LCSW [phone] phone [phone] work mobile

## 2022-01-09 NOTE — ED Notes (Signed)
Patient observed in day room watching TV. Fluids and Snacks offered. Patient verbalizes no complaints at this time and appears in no distress. Will continue to monitor.   

## 2022-01-09 NOTE — ED Notes (Signed)
Pt resting in bed. Respirations even and unlabored. Monitoring for safety. 

## 2022-01-09 NOTE — ED Notes (Signed)
Pt is sleeping at present. No distress noted. Will continue to monitor.  

## 2022-01-09 NOTE — ED Notes (Signed)
Patient in AAA meeting.

## 2022-01-09 NOTE — ED Notes (Signed)
Pt sleeping at present. No distress noted. Will continue to monitor for safety 

## 2022-01-10 DIAGNOSIS — Z79899 Other long term (current) drug therapy: Secondary | ICD-10-CM | POA: Diagnosis not present

## 2022-01-10 DIAGNOSIS — G40909 Epilepsy, unspecified, not intractable, without status epilepticus: Secondary | ICD-10-CM | POA: Diagnosis not present

## 2022-01-10 DIAGNOSIS — F1090 Alcohol use, unspecified, uncomplicated: Secondary | ICD-10-CM | POA: Diagnosis not present

## 2022-01-10 DIAGNOSIS — F1010 Alcohol abuse, uncomplicated: Secondary | ICD-10-CM | POA: Diagnosis not present

## 2022-01-10 DIAGNOSIS — Z5900 Homelessness unspecified: Secondary | ICD-10-CM | POA: Diagnosis not present

## 2022-01-10 NOTE — ED Notes (Signed)
Snacks given 

## 2022-01-10 NOTE — ED Provider Notes (Signed)
Behavioral Health Progress Note  Date and Time: 01/10/2022 11:13 AM Name: Sean [name] MRN:  [id number]  Subjective:   Sean ElliotJoseph Kortney is a 21 yr old male who [facility] to be restarted on anti-seizure medications prior to starting [facility]idential [facility].  [facility].  PPHx is significant for EtOH Use Disorder.  PMHx is significant for Epilepsy.  If requesting to leave over the weekend, absent any acute safety risks, he will be discharged and [facility] will be contacted Monday 10/16.  He reports he is doing okay today.  He reports his sleep is good.  He reports his appetite is doing good.  He reports no SI, HI, or AVH.  He reports no issues with his Keppra or Ativan taper.  He reports no withdrawal symptoms.  He reports no cravings.  He reports no symptoms of epilepsy.  Discussed with him that we are still waiting for Monday [facility].  He reports no other concerns at present.  Diagnosis:  Final diagnoses:  Alcohol use disorder    Total Time spent with patient: 20 minutes  Past Psychiatric History: EtOH Use Disorder Past Medical History:  Past Medical History:  Diagnosis Date   Alcohol abuse    Seizures (HCC)    No past surgical history on file. Family History: No family history on file. Family Psychiatric  History: None Reported Social History:  Social History   Substance and Sexual Activity  Alcohol Use Yes   Alcohol/week: 3.0 standard drinks of alcohol   Types: 3 Cans of beer per week   Comment: 3 drinks a day.     Social History   Substance and Sexual Activity  Drug Use Never    Social History   Socioeconomic History   Marital status: Single    Spouse name: Not on file   Number of children: Not on file   Years of education: Not on file   Highest education level: Not on file  Occupational History   Not on file  Tobacco Use   Smoking status: Never   Smokeless tobacco: Never  Vaping Use   Vaping  Use: Never used  Substance and Sexual Activity   Alcohol use: Yes    Alcohol/week: 3.0 standard drinks of alcohol    Types: 3 Cans of beer per week    Comment: 3 drinks a day.   Drug use: Never   Sexual activity: Not Currently  Other Topics Concern   Not on file  Social History Narrative   Live alone in his apartment.    Social Determinants of Health   Financial Resource Strain: Not on file  Food Insecurity: Not on file  Transportation Needs: Not on file  Physical Activity: Not on file  Stress: Not on file  Social Connections: Not on file   SDOH:  SDOH Screenings   Depression (PHQ2-9): Medium Risk (01/10/2022)  Tobacco Use: Low Risk  (09/18/2021)   Additional Social History:    History of alcohol / drug use?: (P) No history of alcohol / drug abuse (Drinking but no illicit drugs.  Started when he was 21 yrs old.  Drinks 12 bottles of beer every day.) Longest period of sobriety (when/how long): (P) Has not been sober since he started at age [age]yrs old. Negative Consequences of Use: (P) Financial, Legal                    Sleep: Good  Appetite:  Good  Current Medications:  Current Facility-Administered Medications  Medication Dose Route Frequency Provider Last Rate Last Admin   acetaminophen (TYLENOL) tablet 650 mg  650 mg Oral Q6H PRN Lenard Lance, FNP       alum & mag hydroxide-simeth (MAALOX/MYLANTA) 200-200-20 MG/5ML suspension 30 mL  30 mL Oral Q4H PRN Lenard Lance, FNP       levETIRAcetam (KEPPRA) tablet 500 mg  500 mg Oral BID Lenard Lance, FNP   500 mg at 01/10/22 0945   loperamide (IMODIUM) capsule 2-4 mg  2-4 mg Oral PRN Lenard Lance, FNP       LORazepam (ATIVAN) injection 2 mg  2 mg Intramuscular Once PRN Carlyn Reichert, MD       LORazepam (ATIVAN) tablet 1 mg  1 mg Oral Q6H PRN Lenard Lance, FNP       LORazepam (ATIVAN) tablet 1 mg  1 mg Oral BID Lenard Lance, FNP   1 mg at 01/10/22 0945   Followed by   Melene Muller ON 01/11/2022] LORazepam (ATIVAN)  tablet 1 mg  1 mg Oral Daily Lenard Lance, FNP       magnesium hydroxide (MILK OF MAGNESIA) suspension 30 mL  30 mL Oral Daily PRN Lenard Lance, FNP       multivitamin with minerals tablet 1 tablet  1 tablet Oral Daily Lenard Lance, FNP   1 tablet at 01/10/22 0945   ondansetron (ZOFRAN-ODT) disintegrating tablet 4 mg  4 mg Oral Q6H PRN Lenard Lance, FNP       thiamine (VITAMIN B1) tablet 100 mg  100 mg Oral Daily Lenard Lance, FNP   100 mg at 01/10/22 0945   No current outpatient medications on file.    Labs  Lab Results:  Admission on 01/07/2022  Component Date Value Ref Range Status   SARS Coronavirus 2 by RT PCR 01/07/2022 NEGATIVE  NEGATIVE Final   Comment: (NOTE) SARS-CoV-2 target nucleic acids are NOT DETECTED.  The SARS-CoV-2 RNA is generally detectable in upper respiratory specimens during the acute phase of infection. The lowest concentration of SARS-CoV-2 viral copies this assay can detect is 138 copies/mL. A negative result does not preclude SARS-Cov-2 infection and should not be used as the sole basis for treatment or other patient management decisions. A negative result may occur with  improper specimen collection/handling, submission of specimen other than nasopharyngeal swab, presence of viral mutation(s) within the areas targeted by this assay, and inadequate number of viral copies(<138 copies/mL). A negative result must be combined with clinical observations, patient history, and epidemiological information. The expected result is Negative.  Fact Sheet for Patients:  BloggerCourse.com  Fact Sheet for Healthcare Providers:  SeriousBroker.it  This test is no                          t yet approved or cleared by the [place] FDA and  has been authorized for detection and/or diagnosis of SARS-CoV-2 by FDA under an Emergency Use Authorization (EUA). This EUA will remain  in effect (meaning this test can be  used) for the duration of the COVID-19 declaration under Section 564(b)(1) of the Act, 21 U.S.C.section 360bbb-3(b)(1), unless the authorization is terminated  or revoked sooner.       Influenza A by PCR 01/07/2022 NEGATIVE  NEGATIVE Final   Influenza B by PCR 01/07/2022 NEGATIVE  NEGATIVE Final   Comment: (NOTE) The Xpert  Xpress SARS-CoV-2/FLU/RSV plus assay is intended as an aid in the diagnosis of influenza from Nasopharyngeal swab specimens and should not be used as a sole basis for treatment. Nasal washings and aspirates are unacceptable for Xpert Xpress SARS-CoV-2/FLU/RSV testing.  Fact Sheet for Patients: BloggerCourse.com  Fact Sheet for Healthcare Providers: SeriousBroker.it  This test is not yet approved or cleared by the [place] FDA and has been authorized for detection and/or diagnosis of SARS-CoV-2 by FDA under an Emergency Use Authorization (EUA). This EUA will remain in effect (meaning this test can be used) for the duration of the COVID-19 declaration under Section 564(b)(1) of the Act, 21 U.S.C. section 360bbb-3(b)(1), unless the authorization is terminated or revoked.  Performed at [facility], [address] [place], [place] [address]    WBC 01/07/2022 4.0  4.0 - 10.5 K/uL Final   RBC 01/07/2022 4.88  4.22 - 5.81 MIL/uL Final   Hemoglobin 01/07/2022 15.4  13.0 - 17.0 g/dL Final   HCT 11/91/4782 41.9  39.0 - 52.0 % Final   MCV 01/07/2022 85.9  80.0 - 100.0 fL Final   MCH 01/07/2022 31.6  26.0 - 34.0 pg Final   MCHC 01/07/2022 36.8 (H)  30.0 - 36.0 g/dL Final   RDW 95/62/1308 12.8  11.5 - 15.5 % Final   Platelets 01/07/2022 217  150 - 400 K/uL Final   nRBC 01/07/2022 0.0  0.0 - 0.2 % Final   Neutrophils Relative % 01/07/2022 50  % Final   Neutro Abs 01/07/2022 2.0  1.7 - 7.7 K/uL Final   Lymphocytes Relative 01/07/2022 39  % Final   Lymphs Abs 01/07/2022 1.6  0.7 - 4.0 K/uL Final   Monocytes  Relative 01/07/2022 6  % Final   Monocytes Absolute 01/07/2022 0.2  0.1 - 1.0 K/uL Final   Eosinophils Relative 01/07/2022 5  % Final   Eosinophils Absolute 01/07/2022 0.2  0.0 - 0.5 K/uL Final   Basophils Relative 01/07/2022 0  % Final   Basophils Absolute 01/07/2022 0.0  0.0 - 0.1 K/uL Final   Immature Granulocytes 01/07/2022 0  % Final   Abs Immature Granulocytes 01/07/2022 0.00  0.00 - 0.07 K/uL Final   Performed at [facility], [address] [place], [place] [address]   Sodium 01/07/2022 136  135 - 145 mmol/L Final   Potassium 01/07/2022 3.5  3.5 - 5.1 mmol/L Final   Chloride 01/07/2022 100  98 - 111 mmol/L Final   CO2 01/07/2022 28  22 - 32 mmol/L Final   Glucose, Bld 01/07/2022 140 (H)  70 - 99 mg/dL Final   Glucose reference range applies only to samples taken after fasting for at least 8 hours.   BUN 01/07/2022 12  6 - 20 mg/dL Final   Creatinine, Ser 01/07/2022 0.75  0.61 - 1.24 mg/dL Final   Calcium 69/62/9528 9.8  8.9 - 10.3 mg/dL Final   Total Protein 41/32/4401 8.1  6.5 - 8.1 g/dL Final   Albumin 02/72/5366 4.7  3.5 - 5.0 g/dL Final   AST 44/05/4740 36  15 - 41 U/L Final   ALT 01/07/2022 53 (H)  0 - 44 U/L Final   Alkaline Phosphatase 01/07/2022 97  38 - 126 U/L Final   Total Bilirubin 01/07/2022 1.2  0.3 - 1.2 mg/dL Final   GFR, Estimated 01/07/2022 >60  >60 mL/min Final   Comment: (NOTE) Calculated using the CKD-EPI Creatinine Equation (2021)    Anion gap 01/07/2022 8  5 -  15 Final   Performed at [facility], [place] [address] [place], [place] [address]   Hgb A1c MFr Bld 01/07/2022 4.2 (L)  4.8 - 5.6 % Final   Comment: (NOTE) Pre diabetes:          5.7%-6.4%  Diabetes:              >6.4%  Glycemic control for   <7.0% adults with diabetes    Mean Plasma Glucose 01/07/2022 73.84  mg/dL Final   Performed at [facility], [place] [address] [place], [place] [address]   Magnesium 01/07/2022 2.0  1.7 - 2.4 mg/dL Final   Performed at [place] [address], [place], [place] [address]   Alcohol, Ethyl (B) 01/07/2022 <10  <10 mg/dL Final   Comment: (NOTE) Lowest detectable limit for serum alcohol is 10 mg/dL.  For medical purposes only. Performed at [facility], [place] [address], [place], [place] [address]    Cholesterol 01/07/2022 201 (H)  0 - 200 mg/dL Final   Triglycerides 01/07/2022 137  <150 mg/dL Final   HDL 01/07/2022 47  >40 mg/dL Final   Total CHOL/HDL Ratio 01/07/2022 4.3  RATIO Final   VLDL 01/07/2022 27  0 - 40 mg/dL Final   LDL Cholesterol 01/07/2022 127 (H)  0 - 99 mg/dL Final   Comment:        Total Cholesterol/HDL:CHD Risk Coronary Heart Disease Risk Table                     Men   Women  1/2 Average Risk   3.4   3.3  Average Risk       5.0   4.4  2 X Average Risk   9.6   7.1  3 X Average Risk  23.4   11.0        Use the calculated Patient Ratio above and the CHD Risk Table to determine the patient's CHD Risk.        ATP III CLASSIFICATION (LDL):  <100     mg/dL   Optimal  100-129  mg/dL   Near or Above                    Optimal  130-159  mg/dL   Borderline  160-189  mg/dL   High  >190     mg/dL   Very High Performed at [place] [address] [place], [place] [address]    TSH 01/07/2022 1.084  0.350 - 4.500 uIU/mL Final   Comment: Performed by a 3rd Generation assay with a functional sensitivity of <=0.01 uIU/mL. Performed at [facility], [place] [address] [place], [place] [address]    Prolactin 01/07/2022 7.8  4.0 - 15.2 ng/mL Final   Comment: (NOTE) Performed At: [facility], [place] [id number] Rush Farmer MD WN:[id number]    POC Amphetamine UR 01/08/2022 None Detected  NONE DETECTED (Cut Off Level 1000 ng/mL) Final   POC Secobarbital (BAR) 01/08/2022 None Detected  NONE DETECTED (Cut Off Level 300 ng/mL) Final   POC Buprenorphine (BUP) 01/08/2022 None Detected  NONE DETECTED (Cut Off Level 10 ng/mL) Final   POC Oxazepam  (BZO) 01/08/2022 Positive (A)  NONE DETECTED (Cut Off Level 300 ng/mL) Final   POC Cocaine UR 01/08/2022 None Detected  NONE DETECTED (Cut Off Level 300 ng/mL) Final   POC Methamphetamine UR 01/08/2022 None Detected  NONE DETECTED (Cut Off Level 1000 ng/mL) Final   POC Morphine 01/08/2022 None Detected  NONE DETECTED (Cut Off Level 300 ng/mL) Final   POC Methadone UR 01/08/2022 None Detected  NONE DETECTED (Cut Off Level 300 ng/mL) Final   POC Oxycodone UR 01/08/2022 None Detected  NONE DETECTED (Cut Off Level 100 ng/mL) Final   POC Marijuana UR 01/08/2022 None Detected  NONE DETECTED (Cut Off Level 50 ng/mL) Final  Admission on 01/07/2022, Discharged on 01/07/2022  Component Date Value Ref Range Status   Glucose-Capillary 01/07/2022 124 (H)  70 - 99 mg/dL Final   Glucose reference range applies only to samples taken after fasting for at least 8 hours.    Blood Alcohol level:  Lab Results  Component Value Date   ETH <10 01/07/2022   ETH 418 (HH) 01/04/2021    Metabolic Disorder Labs: Lab Results  Component Value Date   HGBA1C 4.2 (L) 01/07/2022   MPG 73.84 01/07/2022   Lab Results  Component Value Date   PROLACTIN 7.8 01/07/2022   Lab Results  Component Value Date   CHOL 201 (H) 01/07/2022   TRIG 137 01/07/2022   HDL 47 01/07/2022   CHOLHDL 4.3 01/07/2022   VLDL 27 01/07/2022   LDLCALC 127 (H) 01/07/2022   LDLCALC 110 (H) 12/11/2020    Therapeutic Lab Levels: No results found for: "LITHIUM" No results found for: "VALPROATE" No results found for: "CBMZ"  Physical Findings   PHQ2-9    Flowsheet Row ED from 01/07/2022 in [facility] Office Visit from 12/11/2020 in [facility]  PHQ-2 Total Score 3 0  PHQ-9 Total Score 6 --      Flowsheet Row ED from 01/07/2022 in [facility] ED from 09/18/2021 in [facility] from 07/16/2021 in [facility]  EMERGENCY DEPARTMENT  C-SSRS RISK CATEGORY No Risk No Risk No Risk        Musculoskeletal  Strength & Muscle Tone: within normal limits Gait & Station: normal Patient leans: N/A  Psychiatric Specialty Exam  Presentation  General Appearance:  Appropriate for Environment; Casual  Eye Contact: Good  Speech: Clear and Coherent; Normal Rate  Speech Volume: Normal  Handedness: Right   Mood and Affect  Mood: Euthymic  Affect: Appropriate; Congruent   Thought Process  Thought Processes: Coherent; Goal Directed; Linear  Descriptions of Associations:Intact  Orientation:Full (Time, Place and Person)  Thought Content:WDL; Logical  Diagnosis of Schizophrenia or Schizoaffective disorder in past: No    Hallucinations:Hallucinations: None  Ideas of Reference:None  Suicidal Thoughts:Suicidal Thoughts: No  Homicidal Thoughts:Homicidal Thoughts: No   Sensorium  Memory: Immediate Fair; Recent Fair  Judgment: Fair  Insight: Present   Executive Functions  Concentration: Good  Attention Span: Good  Recall: Good  Fund of Knowledge: Good  Language: Good   Psychomotor Activity  Psychomotor Activity:Psychomotor Activity: Normal   Assets  Assets: Communication Skills; Desire for Improvement; Resilience   Sleep  Sleep:Sleep: Good   No data recorded  Physical Exam  Physical Exam Vitals and nursing note reviewed.  Constitutional:      General: He is not in acute distress.    Appearance: Normal appearance. He is well-developed and normal weight. He is not ill-appearing or toxic-appearing.  HENT:     Head: Normocephalic and atraumatic.  Pulmonary:     Effort: Pulmonary effort is normal. No respiratory distress.  Musculoskeletal:        General: Normal  range of motion.  Neurological:     General: No focal deficit present.     Mental Status: He is alert.    Review of Systems  Respiratory:  Negative for cough and shortness of breath.    Cardiovascular:  Negative for chest pain.  Gastrointestinal:  Negative for abdominal pain, constipation, diarrhea, nausea and vomiting.  Neurological:  Negative for dizziness, weakness and headaches.  Psychiatric/Behavioral:  Negative for depression, hallucinations and suicidal ideas. The patient is not nervous/anxious.    Blood pressure 124/83, pulse 86, temperature 98.1 F (36.7 C), temperature source Oral, resp. rate 20, SpO2 100 %. There is no height or weight on file to calculate BMI.  Treatment Plan Summary: Daily contact with patient to assess and evaluate symptoms and progress in treatment and Medication management  Rmani Kellogg is a 21 yr old male who presented to [facility] on 10/11 to be restarted on anti-seizure medications prior to starting Residential Treatment at [facility].  He was admitted to [facility] on 10/11.  PPHx is significant for EtOH Use Disorder.  PMHx is significant for Epilepsy.  If requesting to leave over the weekend, absent any acute safety risks, he will be discharged and the [facility] Office will be contacted Monday 10/16.   Belluomini is tolerating detox well and is tolerating restarting his Keppra.  He is not having any symptoms of epilepsy.  His Ativan taper is going well and is scheduled to end tomorrow.  The plan is still for him to go to day [facility] and so social work will continue to work on this we will not make any medication changes at this time.  We will continue to monitor.   Withdrawal: -Continue CIWA, last score= 0  @ 10/14 0600 -Continue Ativan taper scheduled to end 10/15 -Continue Ativan 1 mg q6 PRN CIWA>10 -Continue Imodium 2-4 mg PRN diarrhea -Continue Robaxin 500 mg q8 PRN muscle spasms -Continue Naproxen 500 mg BID PRN pain -Continue Zofran-ODT 4 mg q6 PRN nausea -Continue Bentyl 20 mg q6 PRN spasms -Continue Thiamine 100 mg daily for nutritional supplementation -Continue Multivitamin daily for nutritional supplementation   Epilepsy: -Continue Keppra  500 mg BID -Continue IM Ativan 2 mg once PRN seizure   -Continue PRN's: Tylenol, Maalox, Milk of Magnesia   Lauro Franklin, MD 01/10/2022 11:13 AM

## 2022-01-10 NOTE — ED Notes (Signed)
Pt is in AA group meeting 

## 2022-01-10 NOTE — ED Notes (Signed)
Patient is awake and alert in dayroom for breakfast.  He is guarded and minimally verbal.  No distress or complaint at this time.  Will monitor.

## 2022-01-10 NOTE — Progress Notes (Signed)
Patient is resting in bed without distress or complaint.  No evidence of withdrawal.  Patient continues to be somewhat oddly related however behavior is in control.  Encouraged him to make needs known to staff.  Will monitor and provide safe environment.

## 2022-01-10 NOTE — ED Notes (Signed)
Pt asleep in bed. Respirations even and unlabored. Monitoring for safety. 

## 2022-01-10 NOTE — ED Notes (Signed)
Pt awake and alert.  sitting in day room watching tv and talking with peers.  Flat affect and congruent mood.   Pt attended [facility] meeting.   Has been taking medications as ordered.  Denies SI, HI or AVH.

## 2022-01-11 DIAGNOSIS — Z79899 Other long term (current) drug therapy: Secondary | ICD-10-CM | POA: Diagnosis not present

## 2022-01-11 DIAGNOSIS — F1010 Alcohol abuse, uncomplicated: Secondary | ICD-10-CM | POA: Diagnosis not present

## 2022-01-11 DIAGNOSIS — Z5900 Homelessness unspecified: Secondary | ICD-10-CM | POA: Diagnosis not present

## 2022-01-11 DIAGNOSIS — F1090 Alcohol use, unspecified, uncomplicated: Secondary | ICD-10-CM | POA: Diagnosis not present

## 2022-01-11 DIAGNOSIS — G40909 Epilepsy, unspecified, not intractable, without status epilepticus: Secondary | ICD-10-CM | POA: Diagnosis not present

## 2022-01-11 NOTE — ED Notes (Signed)
Patient is calm and quiet he is minimally verbal and guarded.  Patient has difficulty expressing needs so needs are anticipated.  Patient denies avh shi or plan.  Encouraged to seek out staff to have needs met.  Will monitor and provide safe environment.

## 2022-01-11 NOTE — ED Notes (Signed)
Pt asleep in bed. Respirations even and unlabored. Monitoring for safety. 

## 2022-01-11 NOTE — ED Provider Notes (Signed)
Behavioral Health Progress Note  Date and Time: 01/11/2022 11:37 AM Name: Wilmon Conover MRN:  [id number]  Subjective:   Shriyans Kuenzi is a 21 yr old male who presented to [facility] on 10/11 to be restarted on anti-seizure medications prior to starting Residential Treatment at [facility].  He was admitted to [facility] on 10/11.  PPHx is significant for EtOH Use Disorder.  PMHx is significant for Epilepsy.  If requesting to leave over the weekend, absent any acute safety risks, he will be discharged and the [facility] Office will be contacted Monday 10/16.   He reports that he is doing good today.  He reports no symptoms of withdrawal.  He reports no cravings.  He reports no symptoms of epilepsy.  He reports no issues with his medications.  Discussed with him that his Ativan taper ends today.  He reports he is still interested in going to residential treatment and asked how he would get there.  Discussed with him that he would be provided with transportation there and he was thankful for this.  He reports no SI, HI, or AVH.  He reports sleep is good.  He reports his appetite is doing good.  He reports no other concerns at present.  Diagnosis:  Final diagnoses:  Alcohol use disorder    Total Time spent with patient: 20 minutes  Past Psychiatric History: EtOH Use Disorder Past Medical History:  Past Medical History:  Diagnosis Date   Alcohol abuse    Seizures (HCC)    No past surgical history on file. Family History: No family history on file. Family Psychiatric  History: None Reported Social History:  Social History   Substance and Sexual Activity  Alcohol Use Yes   Alcohol/week: 3.0 standard drinks of alcohol   Types: 3 Cans of beer per week   Comment: 3 drinks a day.     Social History   Substance and Sexual Activity  Drug Use Never    Social History   Socioeconomic History   Marital status: Single    Spouse name: Not on file   Number of children: Not on file   Years of education: Not on  file   Highest education level: Not on file  Occupational History   Not on file  Tobacco Use   Smoking status: Never   Smokeless tobacco: Never  Vaping Use   Vaping Use: Never used  Substance and Sexual Activity   Alcohol use: Yes    Alcohol/week: 3.0 standard drinks of alcohol    Types: 3 Cans of beer per week    Comment: 3 drinks a day.   Drug use: Never   Sexual activity: Not Currently  Other Topics Concern   Not on file  Social History Narrative   Live alone in his apartment.    Social Determinants of Health   Financial Resource Strain: Not on file  Food Insecurity: Not on file  Transportation Needs: Not on file  Physical Activity: Not on file  Stress: Not on file  Social Connections: Not on file   SDOH:  SDOH Screenings   Depression (PHQ2-9): Medium Risk (01/10/2022)  Tobacco Use: Low Risk  (09/18/2021)   Additional Social History:    History of alcohol / drug use?: (P) No history of alcohol / drug abuse (Drinking but no illicit drugs.  Started when he was 21 yrs old.  Drinks 12 bottles of beer every day.) Longest period of sobriety (when/how long): (P) Has not been sober since he started at age  28yrs old. Negative Consequences of Use: (P) Financial, Legal                    Sleep: Good  Appetite:  Good  Current Medications:  Current Facility-Administered Medications  Medication Dose Route Frequency Provider Last Rate Last Admin   acetaminophen (TYLENOL) tablet 650 mg  650 mg Oral Q6H PRN Lenard Lance, FNP       alum & mag hydroxide-simeth (MAALOX/MYLANTA) 200-200-20 MG/5ML suspension 30 mL  30 mL Oral Q4H PRN Lenard Lance, FNP       levETIRAcetam (KEPPRA) tablet 500 mg  500 mg Oral BID Lenard Lance, FNP   500 mg at 01/11/22 1017   LORazepam (ATIVAN) injection 2 mg  2 mg Intramuscular Once PRN Carlyn Reichert, MD       magnesium hydroxide (MILK OF MAGNESIA) suspension 30 mL  30 mL Oral Daily PRN Lenard Lance, FNP       multivitamin with minerals  tablet 1 tablet  1 tablet Oral Daily Lenard Lance, FNP   1 tablet at 01/11/22 5102   thiamine (VITAMIN B1) tablet 100 mg  100 mg Oral Daily Lenard Lance, FNP   100 mg at 01/11/22 5852   No current outpatient medications on file.    Labs  Lab Results:  Admission on 01/07/2022  Component Date Value Ref Range Status   SARS Coronavirus 2 by RT PCR 01/07/2022 NEGATIVE  NEGATIVE Final   Comment: (NOTE) SARS-CoV-2 target nucleic acids are NOT DETECTED.  The SARS-CoV-2 RNA is generally detectable in upper respiratory specimens during the acute phase of infection. The lowest concentration of SARS-CoV-2 viral copies this assay can detect is 138 copies/mL. A negative result does not preclude SARS-Cov-2 infection and should not be used as the sole basis for treatment or other patient management decisions. A negative result may occur with  improper specimen collection/handling, submission of specimen other than nasopharyngeal swab, presence of viral mutation(s) within the areas targeted by this assay, and inadequate number of viral copies(<138 copies/mL). A negative result must be combined with clinical observations, patient history, and epidemiological information. The expected result is Negative.  Fact Sheet for Patients:  BloggerCourse.com  Fact Sheet for Healthcare Providers:  SeriousBroker.it  This test is no                          t yet approved or cleared by the [place] FDA and  has been authorized for detection and/or diagnosis of SARS-CoV-2 by FDA under an Emergency Use Authorization (EUA). This EUA will remain  in effect (meaning this test can be used) for the duration of the COVID-19 declaration under Section 564(b)(1) of the Act, 21 U.S.C.section 360bbb-3(b)(1), unless the authorization is terminated  or revoked sooner.       Influenza A by PCR 01/07/2022 NEGATIVE  NEGATIVE Final   Influenza B by PCR 01/07/2022  NEGATIVE  NEGATIVE Final   Comment: (NOTE) The Xpert Xpress SARS-CoV-2/FLU/RSV plus assay is intended as an aid in the diagnosis of influenza from Nasopharyngeal swab specimens and should not be used as a sole basis for treatment. Nasal washings and aspirates are unacceptable for Xpert Xpress SARS-CoV-2/FLU/RSV testing.  Fact Sheet for Patients: BloggerCourse.com  Fact Sheet for Healthcare Providers: SeriousBroker.it  This test is not yet approved or cleared by the [place] FDA and has been authorized for detection and/or diagnosis of SARS-CoV-2 by FDA under an Emergency  Use Authorization (EUA). This EUA will remain in effect (meaning this test can be used) for the duration of the COVID-19 declaration under Section 564(b)(1) of the Act, 21 U.S.C. section 360bbb-3(b)(1), unless the authorization is terminated or revoked.  Performed at [facility], [address], [place], [place] [address]    WBC 01/07/2022 4.0  4.0 - 10.5 K/uL Final   RBC 01/07/2022 4.88  4.22 - 5.81 MIL/uL Final   Hemoglobin 01/07/2022 15.4  13.0 - 17.0 g/dL Final   HCT 32/35/5732 41.9  39.0 - 52.0 % Final   MCV 01/07/2022 85.9  80.0 - 100.0 fL Final   MCH 01/07/2022 31.6  26.0 - 34.0 pg Final   MCHC 01/07/2022 36.8 (H)  30.0 - 36.0 g/dL Final   RDW 20/25/4270 12.8  11.5 - 15.5 % Final   Platelets 01/07/2022 217  150 - 400 K/uL Final   nRBC 01/07/2022 0.0  0.0 - 0.2 % Final   Neutrophils Relative % 01/07/2022 50  % Final   Neutro Abs 01/07/2022 2.0  1.7 - 7.7 K/uL Final   Lymphocytes Relative 01/07/2022 39  % Final   Lymphs Abs 01/07/2022 1.6  0.7 - 4.0 K/uL Final   Monocytes Relative 01/07/2022 6  % Final   Monocytes Absolute 01/07/2022 0.2  0.1 - 1.0 K/uL Final   Eosinophils Relative 01/07/2022 5  % Final   Eosinophils Absolute 01/07/2022 0.2  0.0 - 0.5 K/uL Final   Basophils Relative 01/07/2022 0  % Final   Basophils Absolute 01/07/2022 0.0   0.0 - 0.1 K/uL Final   Immature Granulocytes 01/07/2022 0  % Final   Abs Immature Granulocytes 01/07/2022 0.00  0.00 - 0.07 K/uL Final   Performed at [facility], [address] [place], [place] [address]   Sodium 01/07/2022 136  135 - 145 mmol/L Final   Potassium 01/07/2022 3.5  3.5 - 5.1 mmol/L Final   Chloride 01/07/2022 100  98 - 111 mmol/L Final   CO2 01/07/2022 28  22 - 32 mmol/L Final   Glucose, Bld 01/07/2022 140 (H)  70 - 99 mg/dL Final   Glucose reference range applies only to samples taken after fasting for at least 8 hours.   BUN 01/07/2022 12  6 - 20 mg/dL Final   Creatinine, Ser 01/07/2022 0.75  0.61 - 1.24 mg/dL Final   Calcium 28/31/5176 9.8  8.9 - 10.3 mg/dL Final   Total Protein 16/09/3708 8.1  6.5 - 8.1 g/dL Final   Albumin 62/69/4854 4.7  3.5 - 5.0 g/dL Final   AST 62/70/3500 36  15 - 41 U/L Final   ALT 01/07/2022 53 (H)  0 - 44 U/L Final   Alkaline Phosphatase 01/07/2022 97  38 - 126 U/L Final   Total Bilirubin 01/07/2022 1.2  0.3 - 1.2 mg/dL Final   GFR, Estimated 01/07/2022 >60  >60 mL/min Final   Comment: (NOTE) Calculated using the CKD-EPI Creatinine Equation (2021)    Anion gap 01/07/2022 8  5 - 15 Final   Performed at [facility], [address], [place], [place] [address]   Hgb A1c MFr Bld 01/07/2022 4.2 (L)  4.8 - 5.6 % Final   Comment: (NOTE) Pre diabetes:          5.7%-6.4%  Diabetes:              >6.4%  Glycemic control for   <7.0% adults with diabetes    Mean Plasma Glucose 01/07/2022 73.84  mg/dL Final   Performed at [facility], [address] [place], [place] [address]   Magnesium 01/07/2022 2.0  1.7 - 2.4 mg/dL Final   Performed at [facility], [address] [place], [place] [address]   Alcohol, Ethyl (B) 01/07/2022 <10  <10 mg/dL Final   Comment: (NOTE) Lowest detectable limit for serum alcohol is 10 mg/dL.  For medical purposes only. Performed at [facility], [address] [place],  [place] [address]    Cholesterol 01/07/2022 201 (H)  0 - 200 mg/dL Final   Triglycerides 91/47/829510/01/2022 137  <150 mg/dL Final   HDL 62/13/086510/01/2022 47  >40 mg/dL Final   Total CHOL/HDL Ratio 01/07/2022 4.3  RATIO Final   VLDL 01/07/2022 27  0 - 40 mg/dL Final   LDL Cholesterol 01/07/2022 127 (H)  0 - 99 mg/dL Final   Comment:        Total Cholesterol/HDL:CHD Risk Coronary Heart Disease Risk Table                     Men   Women  1/2 Average Risk   3.4   3.3  Average Risk       5.0   4.4  2 X Average Risk   9.6   7.1  3 X Average Risk  23.4   11.0        Use the calculated Patient Ratio above and the CHD Risk Table to determine the patient's CHD Risk.        ATP III CLASSIFICATION (LDL):  <100     mg/dL   Optimal  [id number]  mg/dL   Near or Above                    Optimal  130-159  mg/dL   Borderline  [id number]  mg/dL   High  >132>190     mg/dL   Very High Performed at [facility], [address] [place], [place] [address]    TSH 01/07/2022 1.084  0.350 - 4.500 uIU/mL Final   Comment: Performed by a 3rd Generation assay with a functional sensitivity of <=0.01 uIU/mL. Performed at [facility], [address] [place], [place] [address]    Prolactin 01/07/2022 7.8  4.0 - 15.2 ng/mL Final   Comment: (NOTE) Performed At: [facility] [address] [place], [place] [id number] Jolene SchimkeNagendra Sanjai MD QV:[phone]    POC Amphetamine UR 01/08/2022 None Detected  NONE DETECTED (Cut Off Level 1000 ng/mL) Final   POC Secobarbital (BAR) 01/08/2022 None Detected  NONE DETECTED (Cut Off Level 300 ng/mL) Final   POC Buprenorphine (BUP) 01/08/2022 None Detected  NONE DETECTED (Cut Off Level 10 ng/mL) Final   POC Oxazepam (BZO) 01/08/2022 Positive (A)  NONE DETECTED (Cut Off Level 300 ng/mL) Final   POC Cocaine UR 01/08/2022 None Detected  NONE DETECTED (Cut Off Level 300 ng/mL) Final   POC Methamphetamine UR 01/08/2022 None Detected  NONE DETECTED (Cut Off Level 1000 ng/mL) Final   POC  Morphine 01/08/2022 None Detected  NONE DETECTED (Cut Off Level 300 ng/mL) Final   POC Methadone UR 01/08/2022 None Detected  NONE DETECTED (Cut Off Level 300 ng/mL) Final   POC Oxycodone UR 01/08/2022 None Detected  NONE DETECTED (Cut Off Level 100 ng/mL) Final   POC Marijuana UR 01/08/2022 None Detected  NONE DETECTED (Cut Off Level 50 ng/mL) Final  Admission on 01/07/2022, Discharged on 01/07/2022  Component Date Value Ref Range Status   Glucose-Capillary 01/07/2022 124 (H)  70 - 99 mg/dL Final   Glucose reference range applies only to samples taken after fasting for at least 8 hours.    Blood Alcohol level:  Lab Results  Component Value Date   ETH <10 01/07/2022   ETH 418 (HH) 29/51/8841    Metabolic Disorder Labs: Lab Results  Component Value Date   HGBA1C 4.2 (L) 01/07/2022   MPG 73.84 01/07/2022   Lab Results  Component Value Date   PROLACTIN 7.8 01/07/2022   Lab Results  Component Value Date   CHOL 201 (H) 01/07/2022   TRIG 137 01/07/2022   HDL 47 01/07/2022   CHOLHDL 4.3 01/07/2022   VLDL 27 01/07/2022   LDLCALC 127 (H) 01/07/2022   LDLCALC 110 (H) 12/11/2020    Therapeutic Lab Levels: No results found for: "LITHIUM" No results found for: "VALPROATE" No results found for: "CBMZ"  Physical Findings   PHQ2-9    [place] ED from 01/07/2022 in [facility] Office Visit from 12/11/2020 in [place]  PHQ-2 Total Score 1 0  PHQ-9 Total Score 5 --      [place] ED from 01/07/2022 in [facility] ED from 09/18/2021 in [place] [facility]PT ED from 07/16/2021 in [place] No Risk No Risk No Risk        Musculoskeletal  Strength & Muscle Tone: within normal limits Gait & Station: normal Patient leans: N/A  Psychiatric Specialty Exam  Presentation  General Appearance:  Appropriate for  Environment; Casual  Eye Contact: Good  Speech: Clear and Coherent; Normal Rate  Speech Volume: Normal  Handedness: Right   Mood and Affect  Mood: Euthymic  Affect: Congruent; Appropriate   Thought Process  Thought Processes: Coherent; Goal Directed; Linear  Descriptions of Associations:Intact  Orientation:Full (Time, Place and Person)  Thought Content:Logical; WDL  Diagnosis of Schizophrenia or Schizoaffective disorder in past: No    Hallucinations:Hallucinations: None  Ideas of Reference:None  Suicidal Thoughts:Suicidal Thoughts: No  Homicidal Thoughts:Homicidal Thoughts: No   Sensorium  Memory: Immediate Fair; Recent Fair  Judgment: Fair  Insight: Fair   Community education officer  Concentration: Good  Attention Span: Good  Recall: Good  Fund of Knowledge: Good  Language: Good   Psychomotor Activity  Psychomotor Activity: Psychomotor Activity: Normal   Assets  Assets: Communication Skills; Desire for Improvement; Resilience; Physical Health   Sleep  Sleep: Sleep: Good   No data recorded  Physical Exam  Physical Exam Vitals and nursing note reviewed.  Constitutional:      General: He is not in acute distress.    Appearance: Normal appearance. He is normal weight. He is not ill-appearing or toxic-appearing.  HENT:     Head: Normocephalic and atraumatic.  Pulmonary:     Effort: Pulmonary effort is normal.  Musculoskeletal:        General: Normal range of motion.  Neurological:     General: No focal deficit present.     Mental Status: He is alert.    Review of Systems  Respiratory:  Negative for cough and shortness of breath.   Cardiovascular:  Negative for chest pain.  Gastrointestinal:  Negative for abdominal pain, constipation, diarrhea, nausea and vomiting.  Neurological:  Negative for dizziness, weakness and headaches.   Blood pressure 131/89, pulse (!) 57, temperature 98.1 F (36.7 C), temperature source  Oral, resp. rate 18, SpO2 100 %. There is no height or weight on file to calculate BMI.  Treatment Plan Summary: Daily contact with patient to assess and evaluate symptoms and progress in treatment and Medication management  Alvon Nygaard is a 21 yr old male who presented to [facility] on 10/11 to be restarted on anti-seizure medications prior to starting Residential Treatment at [facility].  He was admitted to [facility] on 10/11.  PPHx is significant for EtOH Use Disorder.  PMHx is significant for Epilepsy.  If requesting to leave over the weekend, absent any acute safety risks, he will be discharged and the [facility] Office will be contacted Monday 10/16.     [place] continues to tolerate his detox without withdrawal symptoms.  His Ativan taper will end today.  He continues to be interested in going to residential treatment.  We will not make any changes to his medications at this time.  We will continue to monitor.     Withdrawal: -Continue CIWA, last score= 0  @ 10/15 0542 -Continue Ativan taper scheduled to end 10/15 -Continue Ativan 1 mg q6 PRN CIWA>10 -Continue Imodium 2-4 mg PRN diarrhea -Continue Robaxin 500 mg q8 PRN muscle spasms -Continue Naproxen 500 mg BID PRN pain -Continue Zofran-ODT 4 mg q6 PRN nausea -Continue Bentyl 20 mg q6 PRN spasms -Continue Thiamine 100 mg daily for nutritional supplementation -Continue Multivitamin daily for nutritional supplementation     Epilepsy: -Continue Keppra 500 mg BID -Continue IM Ativan 2 mg once PRN seizure     -Continue PRN's: Tylenol, Maalox, Milk of Magnesia   Lauro Franklin, MD 01/11/2022 11:37 AM

## 2022-01-11 NOTE — Progress Notes (Signed)
Patient attended a coping skills group with RN and participated well.   

## 2022-01-11 NOTE — ED Notes (Signed)
Pt sitting in dining room watching TV. A&O x4, calm and cooperative. Denies current SI/HI/AVH. No signs of distress noted. Monitoring for safety.  

## 2022-01-12 DIAGNOSIS — G40909 Epilepsy, unspecified, not intractable, without status epilepticus: Secondary | ICD-10-CM | POA: Diagnosis not present

## 2022-01-12 DIAGNOSIS — F1090 Alcohol use, unspecified, uncomplicated: Secondary | ICD-10-CM | POA: Diagnosis not present

## 2022-01-12 DIAGNOSIS — Z79899 Other long term (current) drug therapy: Secondary | ICD-10-CM | POA: Diagnosis not present

## 2022-01-12 DIAGNOSIS — F1010 Alcohol abuse, uncomplicated: Secondary | ICD-10-CM | POA: Diagnosis not present

## 2022-01-12 DIAGNOSIS — Z5900 Homelessness unspecified: Secondary | ICD-10-CM | POA: Diagnosis not present

## 2022-01-12 NOTE — ED Notes (Signed)
Pt asleep in bed. Respirations even and unlabored. Monitoring for safety. 

## 2022-01-12 NOTE — ED Notes (Signed)
Pt is in the bed sleeping. Respirations are even and unlabored. No acute distress noted. Will continue to monitor for safety. 

## 2022-01-12 NOTE — ED Notes (Signed)
Pt requested scrubs and for clothes to be washed.  Scrubs provided, brown bag given to pt for dirty laundry.

## 2022-01-12 NOTE — ED Notes (Addendum)
Pt sitting in dining room watching TV. Denies any current needs. States he doesn't want medication to help him sleep. No signs of distress noted. Monitoring for safety.

## 2022-01-12 NOTE — ED Notes (Signed)
Pt is A & Ox 4. Pt is the day room watching TV with peers. Respirations are even and unlabored, no distress noted. Will continue to monitor to safety.

## 2022-01-12 NOTE — ED Provider Notes (Signed)
Behavioral Health Progress Note  Date and Time: 01/12/2022 5:33 PM Name: Sean [name] MRN:  [id number]  Subjective:   Sean [name] is a 21 year old male with a past psychiatric history of alcohol use disorder and seizure disorder.  The patient receives frequent care in the emergency department and has had a limited follow-up with outpatient providers.  He is currently on Keppra 500 mg twice daily for seizures.  The patient presented to the [facility] behavioral health urgent care with request for detox and placement in residential rehab.  LCSW from jail Caroll Rancher, [phone], reports the patient will go back to jail unless he attends residential rehab.  On interview and assessment today, the patient denies experiencing any withdrawal symptoms.  He reports appropriate sleep and appetite.  He denies experiencing suicidal thoughts over the past 24 hours.  The interview was conducted with the help of a Swahili virtual interpreter.  The patient reports interest in going back to caring services.  Unfortunately we learned later that this is not possible.  The patient has been rejected at [facility] based on his seizure history.  The patient has stated some interest in a sober living arrangement or work program.  We will continue to investigate these options.  Diagnosis:  Final diagnoses:  Alcohol use disorder    Total Time spent with patient: 30 minutes  Past Psychiatric History: as above Past Medical History:  Past Medical History:  Diagnosis Date   Alcohol abuse    Seizures ([place])    No past surgical history on file. Family History: No family history on file. Family Psychiatric  History: per H and P Social History:  Social History   Substance and Sexual Activity  Alcohol Use Yes   Alcohol/week: 3.0 standard drinks of alcohol   Types: 3 Cans of beer per week   Comment: 3 drinks a day.     Social History   Substance and Sexual Activity  Drug Use Never    Social History    Socioeconomic History   Marital status: Single    Spouse name: Not on file   Number of children: Not on file   Years of education: Not on file   Highest education level: Not on file  Occupational History   Not on file  Tobacco Use   Smoking status: Never   Smokeless tobacco: Never  Vaping Use   Vaping Use: Never used  Substance and Sexual Activity   Alcohol use: Yes    Alcohol/week: 3.0 standard drinks of alcohol    Types: 3 Cans of beer per week    Comment: 3 drinks a day.   Drug use: Never   Sexual activity: Not Currently  Other Topics Concern   Not on file  Social History Narrative   Live alone in his apartment.    Social Determinants of Health   Financial Resource Strain: Not on file  Food Insecurity: Not on file  Transportation Needs: Not on file  Physical Activity: Not on file  Stress: Not on file  Social Connections: Not on file   SDOH:  SDOH Screenings   Depression (PHQ2-9): Medium Risk (01/10/2022)  Tobacco Use: Low Risk  (09/18/2021)   Additional Social History:    History of alcohol / drug use?: (P) No history of alcohol / drug abuse (Drinking but no illicit drugs.  Started when he was 21 yrs old.  Drinks 12 bottles of beer every day.) Longest period of sobriety (when/how long): (P) Has not been sober since he  started at age 77yrs old. Negative Consequences of Use: (P) Financial, Legal                    Sleep: Fair  Appetite:  Fair  Current Medications:  Current Facility-Administered Medications  Medication Dose Route Frequency Provider Last Rate Last Admin   acetaminophen (TYLENOL) tablet 650 mg  650 mg Oral Q6H PRN Lucky Rathke, FNP       alum & mag hydroxide-simeth (MAALOX/MYLANTA) 200-200-20 MG/5ML suspension 30 mL  30 mL Oral Q4H PRN Lucky Rathke, FNP       levETIRAcetam (KEPPRA) tablet 500 mg  500 mg Oral BID Lucky Rathke, FNP   500 mg at 01/12/22 0919   LORazepam (ATIVAN) injection 2 mg  2 mg Intramuscular Once PRN Corky Sox, MD       magnesium hydroxide (MILK OF MAGNESIA) suspension 30 mL  30 mL Oral Daily PRN Lucky Rathke, FNP       multivitamin with minerals tablet 1 tablet  1 tablet Oral Daily Lucky Rathke, FNP   1 tablet at 01/12/22 0919   thiamine (VITAMIN B1) tablet 100 mg  100 mg Oral Daily Lucky Rathke, FNP   100 mg at 01/12/22 0919   No current outpatient medications on file.    Labs  Lab Results:  Admission on 01/07/2022  Component Date Value Ref Range Status   SARS Coronavirus 2 by RT PCR 01/07/2022 NEGATIVE  NEGATIVE Final   Comment: (NOTE) SARS-CoV-2 target nucleic acids are NOT DETECTED.  The SARS-CoV-2 RNA is generally detectable in upper respiratory specimens during the acute phase of infection. The lowest concentration of SARS-CoV-2 viral copies this assay can detect is 138 copies/mL. A negative result does not preclude SARS-Cov-2 infection and should not be used as the sole basis for treatment or other patient management decisions. A negative result may occur with  improper specimen collection/handling, submission of specimen other than nasopharyngeal swab, presence of viral mutation(s) within the areas targeted by this assay, and inadequate number of viral copies(<138 copies/mL). A negative result must be combined with clinical observations, patient history, and epidemiological information. The expected result is Negative.  Fact Sheet for Patients:  EntrepreneurPulse.com.au  Fact Sheet for Healthcare Providers:  IncredibleEmployment.be  This test is no                          t yet approved or cleared by the [place] FDA and  has been authorized for detection and/or diagnosis of SARS-CoV-2 by FDA under an Emergency Use Authorization (EUA). This EUA will remain  in effect (meaning this test can be used) for the duration of the COVID-19 declaration under Section 564(b)(1) of the Act, 21 U.S.C.section 360bbb-3(b)(1), unless the  authorization is terminated  or revoked sooner.       Influenza A by PCR 01/07/2022 NEGATIVE  NEGATIVE Final   Influenza B by PCR 01/07/2022 NEGATIVE  NEGATIVE Final   Comment: (NOTE) The Xpert Xpress SARS-CoV-2/FLU/RSV plus assay is intended as an aid in the diagnosis of influenza from Nasopharyngeal swab specimens and should not be used as a sole basis for treatment. Nasal washings and aspirates are unacceptable for Xpert Xpress SARS-CoV-2/FLU/RSV testing.  Fact Sheet for Patients: EntrepreneurPulse.com.au  Fact Sheet for Healthcare Providers: IncredibleEmployment.be  This test is not yet approved or cleared by the [place] FDA and has been authorized for detection and/or diagnosis of SARS-CoV-2 by FDA  under an Emergency Use Authorization (EUA). This EUA will remain in effect (meaning this test can be used) for the duration of the COVID-19 declaration under Section 564(b)(1) of the Act, 21 U.S.C. section 360bbb-3(b)(1), unless the authorization is terminated or revoked.  Performed at [facility], [place] [address] [place], [place] [address]    WBC 01/07/2022 4.0  4.0 - 10.5 K/uL Final   RBC 01/07/2022 4.88  4.22 - 5.81 MIL/uL Final   Hemoglobin 01/07/2022 15.4  13.0 - 17.0 g/dL Final   HCT 01/07/2022 41.9  39.0 - 52.0 % Final   MCV 01/07/2022 85.9  80.0 - 100.0 fL Final   MCH 01/07/2022 31.6  26.0 - 34.0 pg Final   MCHC 01/07/2022 36.8 (H)  30.0 - 36.0 g/dL Final   RDW 01/07/2022 12.8  11.5 - 15.5 % Final   Platelets 01/07/2022 217  150 - 400 K/uL Final   nRBC 01/07/2022 0.0  0.0 - 0.2 % Final   Neutrophils Relative % 01/07/2022 50  % Final   Neutro Abs 01/07/2022 2.0  1.7 - 7.7 K/uL Final   Lymphocytes Relative 01/07/2022 39  % Final   Lymphs Abs 01/07/2022 1.6  0.7 - 4.0 K/uL Final   Monocytes Relative 01/07/2022 6  % Final   Monocytes Absolute 01/07/2022 0.2  0.1 - 1.0 K/uL Final   Eosinophils Relative 01/07/2022 5  %  Final   Eosinophils Absolute 01/07/2022 0.2  0.0 - 0.5 K/uL Final   Basophils Relative 01/07/2022 0  % Final   Basophils Absolute 01/07/2022 0.0  0.0 - 0.1 K/uL Final   Immature Granulocytes 01/07/2022 0  % Final   Abs Immature Granulocytes 01/07/2022 0.00  0.00 - 0.07 K/uL Final   Performed at [facility], [place] [address] [place], [place] [address]   Sodium 01/07/2022 136  135 - 145 mmol/L Final   Potassium 01/07/2022 3.5  3.5 - 5.1 mmol/L Final   Chloride 01/07/2022 100  98 - 111 mmol/L Final   CO2 01/07/2022 28  22 - 32 mmol/L Final   Glucose, Bld 01/07/2022 140 (H)  70 - 99 mg/dL Final   Glucose reference range applies only to samples taken after fasting for at least 8 hours.   BUN 01/07/2022 12  6 - 20 mg/dL Final   Creatinine, Ser 01/07/2022 0.75  0.61 - 1.24 mg/dL Final   Calcium 01/07/2022 9.8  8.9 - 10.3 mg/dL Final   Total Protein 01/07/2022 8.1  6.5 - 8.1 g/dL Final   Albumin 01/07/2022 4.7  3.5 - 5.0 g/dL Final   AST 01/07/2022 36  15 - 41 U/L Final   ALT 01/07/2022 53 (H)  0 - 44 U/L Final   Alkaline Phosphatase 01/07/2022 97  38 - 126 U/L Final   Total Bilirubin 01/07/2022 1.2  0.3 - 1.2 mg/dL Final   GFR, Estimated 01/07/2022 >60  >60 mL/min Final   Comment: (NOTE) Calculated using the CKD-EPI Creatinine Equation (2021)    Anion gap 01/07/2022 8  5 - 15 Final   Performed at [facility] [address], [place], [place] [address]   Hgb A1c MFr Bld 01/07/2022 4.2 (L)  4.8 - 5.6 % Final   Comment: (NOTE) Pre diabetes:          5.7%-6.4%  Diabetes:              >6.4%  Glycemic control for   <7.0% adults with diabetes    Mean Plasma  Glucose 01/07/2022 73.84  mg/dL Final   Performed at [facility] [address] [place], [place] [address]   Magnesium 01/07/2022 2.0  1.7 - 2.4 mg/dL Final   Performed at [facility] [address], [place], [place] [address]   Alcohol, Ethyl (B) 01/07/2022 <10  <10 mg/dL Final   Comment: (NOTE) Lowest  detectable limit for serum alcohol is 10 mg/dL.  For medical purposes only. Performed at [facility], [place] [address] [place], [place] [address]    Cholesterol 01/07/2022 201 (H)  0 - 200 mg/dL Final   Triglycerides 01/07/2022 137  <150 mg/dL Final   HDL 01/07/2022 47  >40 mg/dL Final   Total CHOL/HDL Ratio 01/07/2022 4.3  RATIO Final   VLDL 01/07/2022 27  0 - 40 mg/dL Final   LDL Cholesterol 01/07/2022 127 (H)  0 - 99 mg/dL Final   Comment:        Total Cholesterol/HDL:CHD Risk Coronary Heart Disease Risk Table                     Men   Women  1/2 Average Risk   3.4   3.3  Average Risk       5.0   4.4  2 X Average Risk   9.6   7.1  3 X Average Risk  23.4   11.0        Use the calculated Patient Ratio above and the CHD Risk Table to determine the patient's CHD Risk.        ATP III CLASSIFICATION (LDL):  <100     mg/dL   Optimal  100-129  mg/dL   Near or Above                    Optimal  130-159  mg/dL   Borderline  160-189  mg/dL   High  >190     mg/dL   Very High Performed at [facility] [address] [place], [place] [address]    TSH 01/07/2022 1.084  0.350 - 4.500 uIU/mL Final   Comment: Performed by a 3rd Generation assay with a functional sensitivity of <=0.01 uIU/mL. Performed at [facility], [place] [address], [place], [place] [address]    Prolactin 01/07/2022 7.8  4.0 - 15.2 ng/mL Final   Comment: (NOTE) Performed At: [facility] [place], [place] [id number] Rush Farmer MD [id number]    POC Amphetamine UR 01/08/2022 None Detected  NONE DETECTED (Cut Off Level 1000 ng/mL) Final   POC Secobarbital (BAR) 01/08/2022 None Detected  NONE DETECTED (Cut Off Level 300 ng/mL) Final   POC Buprenorphine (BUP) 01/08/2022 None Detected  NONE DETECTED (Cut Off Level 10 ng/mL) Final   POC Oxazepam (BZO) 01/08/2022 Positive (A)  NONE DETECTED (Cut Off Level 300 ng/mL) Final   POC Cocaine UR 01/08/2022 None Detected  NONE  DETECTED (Cut Off Level 300 ng/mL) Final   POC Methamphetamine UR 01/08/2022 None Detected  NONE DETECTED (Cut Off Level 1000 ng/mL) Final   POC Morphine 01/08/2022 None Detected  NONE DETECTED (Cut Off Level 300 ng/mL) Final   POC Methadone UR 01/08/2022 None Detected  NONE DETECTED (Cut Off Level 300 ng/mL) Final   POC Oxycodone UR 01/08/2022 None Detected  NONE DETECTED (Cut Off Level 100 ng/mL) Final   POC Marijuana UR 01/08/2022 None Detected  NONE DETECTED (Cut Off Level 50 ng/mL) Final  Admission on 01/07/2022,  Discharged on 01/07/2022  Component Date Value Ref Range Status   Glucose-Capillary 01/07/2022 124 (H)  70 - 99 mg/dL Final   Glucose reference range applies only to samples taken after fasting for at least 8 hours.    Blood Alcohol level:  Lab Results  Component Value Date   ETH <10 01/07/2022   ETH 418 (HH) 71/24/5809    Metabolic Disorder Labs: Lab Results  Component Value Date   HGBA1C 4.2 (L) 01/07/2022   MPG 73.84 01/07/2022   Lab Results  Component Value Date   PROLACTIN 7.8 01/07/2022   Lab Results  Component Value Date   CHOL 201 (H) 01/07/2022   TRIG 137 01/07/2022   HDL 47 01/07/2022   CHOLHDL 4.3 01/07/2022   VLDL 27 01/07/2022   LDLCALC 127 (H) 01/07/2022   LDLCALC 110 (H) 12/11/2020    Therapeutic Lab Levels: No results found for: "LITHIUM" No results found for: "VALPROATE" No results found for: "CBMZ"  Physical Findings   PHQ2-9    [place] [facility] ED from 01/07/2022 in [facility] Office Visit from 12/11/2020 in [facility]  PHQ-2 Total Score 1 0  PHQ-9 Total Score 5 --      [place] ED from 01/07/2022 in [facility] ED from 09/18/2021 in [place] DEPT ED from 07/16/2021 in [place] No Risk No Risk No Risk        Musculoskeletal  Strength & Muscle Tone:  within normal limits Gait & Station: normal Patient leans: N/A  Psychiatric Specialty Exam  Presentation  General Appearance:  Appropriate for Environment; Casual  Eye Contact: Good  Speech: Clear and Coherent; Normal Rate  Speech Volume: Normal  Handedness: Right   Mood and Affect  Mood: Euthymic  Affect: Congruent; Appropriate   Thought Process  Thought Processes: Coherent; Goal Directed; Linear  Descriptions of Associations:Intact  Orientation:Full (Time, Place and Person)  Thought Content:Logical; WDL     Hallucinations:Hallucinations: None   Ideas of Reference:None  Suicidal Thoughts:Suicidal Thoughts: No   Homicidal Thoughts:Homicidal Thoughts: No    Sensorium  Memory: Immediate Fair; Recent Fair  Judgment: Fair  Insight: Fair   Community education officer  Concentration: Good  Attention Span: Good  Recall: Good  Fund of Knowledge: Good  Language: Good   Psychomotor Activity  Psychomotor Activity: Psychomotor Activity: Normal    Assets  Assets: Communication Skills; Desire for Improvement; Resilience; Physical Health   Sleep  Sleep: Sleep: Good    No data recorded   Physical Exam  Physical Exam Constitutional:      Appearance: the patient is not toxic-appearing.  Pulmonary:     Effort: Pulmonary effort is normal.  Neurological:     General: No focal deficit present.     Mental Status: the patient is alert and oriented to person, place, and time.   Review of Systems  Respiratory:  Negative for shortness of breath.   Cardiovascular:  Negative for chest pain.  Gastrointestinal:  Negative for abdominal pain, constipation, diarrhea, nausea and vomiting.  Neurological:  Negative for headaches.   Blood pressure 122/74, pulse 86, temperature 98.3 F (36.8 C), temperature source Tympanic, resp. rate 16, SpO2 100 %. There is no height or weight on file to calculate BMI.  Treatment Plan Summary: Daily contact  with patient to assess and evaluate symptoms and progress in treatment and Medication management  Status: Voluntary  Continue home [place]  500 mg twice daily and Ativan taper  Lab work unremarkable  Disposition: Unable to find residential rehab.  This appears to be related to the patient's seizure disorder and is likely additionally impacted by the fact that he is [place] as a second language.  The care team and the patient will attempt to find placement for him at a sober living or work arrangement.  Hopefully his attorneys and judge will find this sufficient for him to avoid going back to jail.  Corky Sox, MD 01/12/2022 5:33 PM
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
## 2022-01-12 NOTE — ED Notes (Signed)
Pt resting quietly in room.  Denies SI, HI, AVH, and pain at this time.  Breathing is even and unlabored.  Will continue to monitor for safety.

## 2022-01-13 DIAGNOSIS — F1090 Alcohol use, unspecified, uncomplicated: Secondary | ICD-10-CM | POA: Diagnosis not present

## 2022-01-13 DIAGNOSIS — Z5900 Homelessness unspecified: Secondary | ICD-10-CM | POA: Diagnosis not present

## 2022-01-13 DIAGNOSIS — Z79899 Other long term (current) drug therapy: Secondary | ICD-10-CM | POA: Diagnosis not present

## 2022-01-13 DIAGNOSIS — G40909 Epilepsy, unspecified, not intractable, without status epilepticus: Secondary | ICD-10-CM | POA: Diagnosis not present

## 2022-01-13 DIAGNOSIS — F1010 Alcohol abuse, uncomplicated: Secondary | ICD-10-CM | POA: Diagnosis not present

## 2022-01-13 NOTE — BH IP Treatment Plan (Signed)
Interdisciplinary Treatment and Diagnostic Plan Update  01/09/2022 Time of Session: [place] MRN: [id number]  Diagnosis:  Final diagnoses:  Alcohol use disorder     Current Medications:  Current Facility-Administered Medications  Medication Dose Route Frequency Provider Last Rate Last Admin   acetaminophen (TYLENOL) tablet 650 mg  650 mg Oral Q6H PRN Lucky Rathke, FNP       alum & mag hydroxide-simeth (MAALOX/MYLANTA) 200-200-20 MG/5ML suspension 30 mL  30 mL Oral Q4H PRN Lucky Rathke, FNP       levETIRAcetam (KEPPRA) tablet 500 mg  500 mg Oral BID Lucky Rathke, FNP   500 mg at 01/13/22 1026   LORazepam (ATIVAN) injection 2 mg  2 mg Intramuscular Once PRN Corky Sox, MD       magnesium hydroxide (MILK OF MAGNESIA) suspension 30 mL  30 mL Oral Daily PRN Lucky Rathke, FNP       multivitamin with minerals tablet 1 tablet  1 tablet Oral Daily Lucky Rathke, FNP   1 tablet at 01/13/22 1026   thiamine (VITAMIN B1) tablet 100 mg  100 mg Oral Daily Lucky Rathke, FNP   100 mg at 01/13/22 1026   No current outpatient medications on file.   PTA Medications: Prior to Admission medications   Not on File    Patient Stressors: Financial difficulties   Legal issue   Loss of housing and employment   Medication change or noncompliance   Occupational concerns   Substance abuse   Other: Homeless; English is not a second language.    Patient Strengths: Average or above average intelligence  Work skills   Treatment Modalities: Medication Management, Group therapy, Case management,  1 to 1 session with clinician, Psychoeducation, Recreational therapy.   Physician Treatment Plan for Primary and Secondary Diagnosis:  Final diagnoses:  Alcohol use disorder   Long Term Goal(s): Improvement in symptoms so as ready for discharge  Short Term Goals: Patient will verbalize feelings in meetings with treatment team members. Patient will attend at least of 50% of the groups  daily. Pt will complete the PHQ9 on admission, day 3 and discharge. Patient will participate in completing the [place] Patient will score a low risk of violence for 24 hours prior to discharge Patient will take medications as prescribed daily.  Medication Management: Evaluate patient's response, side effects, and tolerance of medication regimen.  Therapeutic Interventions: 1 to 1 sessions, Unit Group sessions and Medication administration.  Evaluation of Outcomes: Adequate for Discharge  LCSW Treatment Plan for Primary Diagnosis:  Final diagnoses:  Alcohol use disorder    Long Term Goal(s): Safe transition to appropriate next level of care at discharge.  Short Term Goals: Facilitate acceptance of mental health diagnosis and concerns through verbal commitment to aftercare plan and appointments at discharge., Patient will identify one social support prior to discharge to aid in patient's recovery., Patient will attend AA/NA groups as scheduled., Identify minimum of 2 triggers associated with mental health/substance abuse issues with treatment team members., and Increase skills for wellness and recovery by attending 50% of scheduled groups.  Therapeutic Interventions: Assess for all discharge needs, 1 to 1 time with Education officer, museum, Explore available resources and support systems, Assess for adequacy in community support network, Educate family and significant other(s) on suicide prevention, Complete Psychosocial Assessment, Interpersonal group therapy.  Evaluation of Outcomes: Adequate for Discharge   Progress in Treatment: Attending groups: Yes. Participating in groups: Yes. Taking medication as  prescribed: Yes. Toleration medication: Yes. Family/Significant other contact made: No, will contact:  Has been in contact with his social worker from jail, [place], [phone]. Patient understands diagnosis: Yes. Discussing patient identified  problems/goals with staff: Yes. Medical problems stabilized or resolved: Yes. Denies suicidal/homicidal ideation: Yes. Issues/concerns per patient self-inventory: Yes. Other: More pre-occupied with getting housing and finding work.  New problem(s) identified: Yes, Describe:  Pt interested in treatment, but more concerned about finding housing and work.  New Short Term/Long Term Goal(s):  To live independently and have employment.  Patient Goals:  Find work and a house.  Discharge Plan or Barriers: Does not have the insight or forward thinking to understand that getting treatment will reduce his risk of relapsing.  He has ongoing misdemeanor charges.  His bond was waived and court dates continued so he could receive treatment.  He is more concerned about being in the same situation now - homeless and unemployed - after being discharged from a treatment center.  Disposition was to be with [facility], but his recent seizure makes him ineligible for the time being even though he is now stabilized on his medication, Keppra.  Reason for Continuation of Hospitalization: Medication stabilization  Estimated Length of Stay:  Last 3 [place] Suicide Severity Risk Score: [place] ED from 01/07/2022 in [facility] ED from 09/18/2021 in [place] DEPT ED from 07/16/2021 in [place] No Risk No Risk No Risk       Last PHQ 2/9 Scores:    01/10/2022    7:21 AM 01/10/2022    7:06 AM 12/11/2020   11:21 AM  Depression screen PHQ 2/9  Decreased Interest 0 2 0  Down, Depressed, Hopeless 1 1 0  PHQ - 2 Score 1 3 0  Altered sleeping 1 0   Tired, decreased energy 0 2   Change in appetite 1 1   Feeling bad or failure about yourself  0 0   Trouble concentrating 2 0   Moving slowly or fidgety/restless 0 0   Suicidal thoughts 0 0   PHQ-9 Score 5 6   Difficult doing work/chores Very  difficult Somewhat difficult     Scribe for Treatment Team: Kerri Perches, LCSW 01/13/2022 1:38 PM

## 2022-01-13 NOTE — Progress Notes (Signed)
LCSW Progress Note   1000 - LCSW contacted pt's social worker working for the [employer], [place], [phone], and left a message for a return call.  1126 - LCSW made contact with Ms. Wareham to provide an update regarding the pt's disposition and to clarify if he would be going back to jail if he is not placed in treatment.  Ms. Ottis Stain stated she would consult with her supervisor on the matter.    1202 - LCSW was contacted by Ms. Wareham and informed that he could be discharged to a shelter or wherever he wanted to go, and that he would not need to return to jail.  She expressed concern that he would not accept any more attempts to get him into an inpatient treatment facility and for his lack of insight into how he will be able to abstain from alcohol without any change in his social activities or peers.    LCSW contacted [facility] in [place] to check on bed availability and make a referral if possible.  LCSW spoke with the intake case worker, "Gerald Stabs" regarding the pt.  LCSW learned that the pt would not be eligible because of his legal issues still in process.  LCSW passed information on to Lucius Conn, LCSW for her to continue with the pt's disposition and discharge process.   Omelia Blackwater, MSW, [place] [facility] [phone] phone [phone] work mobile

## 2022-01-13 NOTE — ED Provider Notes (Signed)
Behavioral Health Progress Note  Date and Time: 01/13/2022 6:13 PM Name: Sean [name] MRN:  [id number]  Subjective:   Sean [name] is a 21 year old male with a past psychiatric history of alcohol use disorder and seizure disorder.  The patient receives frequent care in the emergency department and has had a limited follow-up with outpatient providers.  He is currently on Keppra 500 mg twice daily for seizures.  The patient presented to the [facility] behavioral health urgent care with request for detox and placement in residential rehab.  LCSW from jail Caroll Rancher, [phone], reports the patient will go back to jail unless he attends residential rehab.  On interview and assessment today, performed with the help of a Swahili interpreter, much effort is put into talking the patient there is different disposition options.  The patient has previously declined to go to the rescue mission or sober living arrangement.  He states that he feels like his money is being stolen from him if he goes to work program where he is not paid directly.  Discussed with him another potential rehab option (the patient has been declined at [facility] and caring services).  The patient declined this new option.  Discussed the patient's case with the patient's jail social worker.  She reports that even though the patient's bond was waived, that he can be discharged without the involvement of the sheriff's office.  She states that she has discussed this with the patient's attorney and the jail.  Discussed with her the plan that he will be discharged tomorrow given that on and the enormous effort has been placed in finding him on appropriate treatment option.  He has declined several options and has been declined from 2 of our most common residential rehabs.   Diagnosis:  Final diagnoses:  Alcohol use disorder    Total Time spent with patient: 30 minutes  Past Psychiatric History: as above Past Medical History:  Past  Medical History:  Diagnosis Date   Alcohol abuse    Seizures ([facility])    No past surgical history on file. Family History: No family history on file. Family Psychiatric  History: per H and P Social History:  Social History   Substance and Sexual Activity  Alcohol Use Yes   Alcohol/week: 3.0 standard drinks of alcohol   Types: 3 Cans of beer per week   Comment: 3 drinks a day.     Social History   Substance and Sexual Activity  Drug Use Never    Social History   Socioeconomic History   Marital status: Single    Spouse name: Not on file   Number of children: Not on file   Years of education: Not on file   Highest education level: Not on file  Occupational History   Not on file  Tobacco Use   Smoking status: Never   Smokeless tobacco: Never  Vaping Use   Vaping Use: Never used  Substance and Sexual Activity   Alcohol use: Yes    Alcohol/week: 3.0 standard drinks of alcohol    Types: 3 Cans of beer per week    Comment: 3 drinks a day.   Drug use: Never   Sexual activity: Not Currently  Other Topics Concern   Not on file  Social History Narrative   Live alone in his apartment.    Social Determinants of Health   Financial Resource Strain: Not on file  Food Insecurity: Not on file  Transportation Needs: Not on file  Physical Activity:  Not on file  Stress: Not on file  Social Connections: Not on file   SDOH:  SDOH Screenings   Depression (PHQ2-9): Medium Risk (01/10/2022)  Tobacco Use: Low Risk  (09/18/2021)   Additional Social History:    History of alcohol / drug use?: (P) No history of alcohol / drug abuse (Drinking but no illicit drugs.  Started when he was 21 yrs old.  Drinks 12 bottles of beer every day.) Longest period of sobriety (when/how long): (P) Has not been sober since he started at age 49yrs old. Negative Consequences of Use: (P) Financial, Legal                    Sleep: Fair  Appetite:  Fair  Current Medications:  Current  Facility-Administered Medications  Medication Dose Route Frequency Provider Last Rate Last Admin   acetaminophen (TYLENOL) tablet 650 mg  650 mg Oral Q6H PRN Lenard Lance, FNP       alum & mag hydroxide-simeth (MAALOX/MYLANTA) 200-200-20 MG/5ML suspension 30 mL  30 mL Oral Q4H PRN Lenard Lance, FNP       levETIRAcetam (KEPPRA) tablet 500 mg  500 mg Oral BID Lenard Lance, FNP   500 mg at 01/13/22 1026   LORazepam (ATIVAN) injection 2 mg  2 mg Intramuscular Once PRN Carlyn Reichert, MD       magnesium hydroxide (MILK OF MAGNESIA) suspension 30 mL  30 mL Oral Daily PRN Lenard Lance, FNP       multivitamin with minerals tablet 1 tablet  1 tablet Oral Daily Lenard Lance, FNP   1 tablet at 01/13/22 1026   thiamine (VITAMIN B1) tablet 100 mg  100 mg Oral Daily Lenard Lance, FNP   100 mg at 01/13/22 1026   No current outpatient medications on file.    Labs  Lab Results:  Admission on 01/07/2022  Component Date Value Ref Range Status   SARS Coronavirus 2 by RT PCR 01/07/2022 NEGATIVE  NEGATIVE Final   Comment: (NOTE) SARS-CoV-2 target nucleic acids are NOT DETECTED.  The SARS-CoV-2 RNA is generally detectable in upper respiratory specimens during the acute phase of infection. The lowest concentration of SARS-CoV-2 viral copies this assay can detect is 138 copies/mL. A negative result does not preclude SARS-Cov-2 infection and should not be used as the sole basis for treatment or other patient management decisions. A negative result may occur with  improper specimen collection/handling, submission of specimen other than nasopharyngeal swab, presence of viral mutation(s) within the areas targeted by this assay, and inadequate number of viral copies(<138 copies/mL). A negative result must be combined with clinical observations, patient history, and epidemiological information. The expected result is Negative.  Fact Sheet for Patients:  BloggerCourse.com  Fact  Sheet for Healthcare Providers:  SeriousBroker.it  This test is no                          t yet approved or cleared by the [place] FDA and  has been authorized for detection and/or diagnosis of SARS-CoV-2 by FDA under an Emergency Use Authorization (EUA). This EUA will remain  in effect (meaning this test can be used) for the duration of the COVID-19 declaration under Section 564(b)(1) of the Act, 21 U.S.C.section 360bbb-3(b)(1), unless the authorization is terminated  or revoked sooner.       Influenza A by PCR 01/07/2022 NEGATIVE  NEGATIVE Final   Influenza B by PCR  01/07/2022 NEGATIVE  NEGATIVE Final   Comment: (NOTE) The Xpert Xpress SARS-CoV-2/FLU/RSV plus assay is intended as an aid in the diagnosis of influenza from Nasopharyngeal swab specimens and should not be used as a sole basis for treatment. Nasal washings and aspirates are unacceptable for Xpert Xpress SARS-CoV-2/FLU/RSV testing.  Fact Sheet for Patients: BloggerCourse.com  Fact Sheet for Healthcare Providers: SeriousBroker.it  This test is not yet approved or cleared by the [place] FDA and has been authorized for detection and/or diagnosis of SARS-CoV-2 by FDA under an Emergency Use Authorization (EUA). This EUA will remain in effect (meaning this test can be used) for the duration of the COVID-19 declaration under Section 564(b)(1) of the Act, 21 U.S.C. section 360bbb-3(b)(1), unless the authorization is terminated or revoked.  Performed at [facility], [address], [place], [place] [address]    WBC 01/07/2022 4.0  4.0 - 10.5 K/uL Final   RBC 01/07/2022 4.88  4.22 - 5.81 MIL/uL Final   Hemoglobin 01/07/2022 15.4  13.0 - 17.0 g/dL Final   HCT 42/70/6237 41.9  39.0 - 52.0 % Final   MCV 01/07/2022 85.9  80.0 - 100.0 fL Final   MCH 01/07/2022 31.6  26.0 - 34.0 pg Final   MCHC 01/07/2022 36.8 (H)  30.0 - 36.0  g/dL Final   RDW 62/83/1517 12.8  11.5 - 15.5 % Final   Platelets 01/07/2022 217  150 - 400 K/uL Final   nRBC 01/07/2022 0.0  0.0 - 0.2 % Final   Neutrophils Relative % 01/07/2022 50  % Final   Neutro Abs 01/07/2022 2.0  1.7 - 7.7 K/uL Final   Lymphocytes Relative 01/07/2022 39  % Final   Lymphs Abs 01/07/2022 1.6  0.7 - 4.0 K/uL Final   Monocytes Relative 01/07/2022 6  % Final   Monocytes Absolute 01/07/2022 0.2  0.1 - 1.0 K/uL Final   Eosinophils Relative 01/07/2022 5  % Final   Eosinophils Absolute 01/07/2022 0.2  0.0 - 0.5 K/uL Final   Basophils Relative 01/07/2022 0  % Final   Basophils Absolute 01/07/2022 0.0  0.0 - 0.1 K/uL Final   Immature Granulocytes 01/07/2022 0  % Final   Abs Immature Granulocytes 01/07/2022 0.00  0.00 - 0.07 K/uL Final   Performed at [facility], [address] [place], [place] [address]   Sodium 01/07/2022 136  135 - 145 mmol/L Final   Potassium 01/07/2022 3.5  3.5 - 5.1 mmol/L Final   Chloride 01/07/2022 100  98 - 111 mmol/L Final   CO2 01/07/2022 28  22 - 32 mmol/L Final   Glucose, Bld 01/07/2022 140 (H)  70 - 99 mg/dL Final   Glucose reference range applies only to samples taken after fasting for at least 8 hours.   BUN 01/07/2022 12  6 - 20 mg/dL Final   Creatinine, Ser 01/07/2022 0.75  0.61 - 1.24 mg/dL Final   Calcium 37/12/6267 9.8  8.9 - 10.3 mg/dL Final   Total Protein 48/54/6270 8.1  6.5 - 8.1 g/dL Final   Albumin 35/00/9381 4.7  3.5 - 5.0 g/dL Final   AST 82/99/3716 36  15 - 41 U/L Final   ALT 01/07/2022 53 (H)  0 - 44 U/L Final   Alkaline Phosphatase 01/07/2022 97  38 - 126 U/L Final   Total Bilirubin 01/07/2022 1.2  0.3 - 1.2 mg/dL Final   GFR, Estimated 01/07/2022 >60  >60 mL/min Final   Comment: (NOTE) Calculated using the CKD-EPI Creatinine Equation (  2021)    Anion gap 01/07/2022 8  5 - 15 Final   Performed at [facility], [address], [place], [place] [address]   Hgb A1c MFr Bld 01/07/2022 4.2 (L)  4.8 - 5.6 %  Final   Comment: (NOTE) Pre diabetes:          5.7%-6.4%  Diabetes:              >6.4%  Glycemic control for   <7.0% adults with diabetes    Mean Plasma Glucose 01/07/2022 73.84  mg/dL Final   Performed at [facility], [address], [place], [place] [address]   Magnesium 01/07/2022 2.0  1.7 - 2.4 mg/dL Final   Performed at [facility], [address] [place], [place] [address]   Alcohol, Ethyl (B) 01/07/2022 <10  <10 mg/dL Final   Comment: (NOTE) Lowest detectable limit for serum alcohol is 10 mg/dL.  For medical purposes only. Performed at [facility], [address] [place], [place] [address]    Cholesterol 01/07/2022 201 (H)  0 - 200 mg/dL Final   Triglycerides 29/56/2130 137  <150 mg/dL Final   HDL 86/57/8469 47  >40 mg/dL Final   Total CHOL/HDL Ratio 01/07/2022 4.3  RATIO Final   VLDL 01/07/2022 27  0 - 40 mg/dL Final   LDL Cholesterol 01/07/2022 127 (H)  0 - 99 mg/dL Final   Comment:        Total Cholesterol/HDL:CHD Risk Coronary Heart Disease Risk Table                     Men   Women  1/2 Average Risk   3.4   3.3  Average Risk       5.0   4.4  2 X Average Risk   9.6   7.1  3 X Average Risk  23.4   11.0        Use the calculated Patient Ratio above and the CHD Risk Table to determine the patient's CHD Risk.        ATP III CLASSIFICATION (LDL):  <100     mg/dL   Optimal  629-528  mg/dL   Near or Above                    Optimal  130-159  mg/dL   Borderline  413-244  mg/dL   High  >010     mg/dL   Very High Performed at [facility], [address], [place], [place] [address]    TSH 01/07/2022 1.084  0.350 - 4.500 uIU/mL Final   Comment: Performed by a 3rd Generation assay with a functional sensitivity of <=0.01 uIU/mL. Performed at [facility], [address], [place], [place] [address]    Prolactin 01/07/2022 7.8  4.0 - 15.2 ng/mL Final   Comment: (NOTE) Performed At: [facility] [address] [place],  [place] [id number] Jolene Schimke MD LO:[id number]    POC Amphetamine UR 01/08/2022 None Detected  NONE DETECTED (Cut Off Level 1000 ng/mL) Final   POC Secobarbital (BAR) 01/08/2022 None Detected  NONE DETECTED (Cut Off Level 300 ng/mL) Final   POC Buprenorphine (BUP) 01/08/2022 None Detected  NONE DETECTED (Cut Off Level 10 ng/mL) Final   POC Oxazepam (BZO) 01/08/2022 Positive (A)  NONE DETECTED (Cut Off Level 300 ng/mL) Final   POC Cocaine UR 01/08/2022 None Detected  NONE DETECTED (Cut Off Level 300  ng/mL) Final   POC Methamphetamine UR 01/08/2022 None Detected  NONE DETECTED (Cut Off Level 1000 ng/mL) Final   POC Morphine 01/08/2022 None Detected  NONE DETECTED (Cut Off Level 300 ng/mL) Final   POC Methadone UR 01/08/2022 None Detected  NONE DETECTED (Cut Off Level 300 ng/mL) Final   POC Oxycodone UR 01/08/2022 None Detected  NONE DETECTED (Cut Off Level 100 ng/mL) Final   POC Marijuana UR 01/08/2022 None Detected  NONE DETECTED (Cut Off Level 50 ng/mL) Final  Admission on 01/07/2022, Discharged on 01/07/2022  Component Date Value Ref Range Status   Glucose-Capillary 01/07/2022 124 (H)  70 - 99 mg/dL Final   Glucose reference range applies only to samples taken after fasting for at least 8 hours.    Blood Alcohol level:  Lab Results  Component Value Date   ETH <10 01/07/2022   ETH 418 (HH) 01/04/2021    Metabolic Disorder Labs: Lab Results  Component Value Date   HGBA1C 4.2 (L) 01/07/2022   MPG 73.84 01/07/2022   Lab Results  Component Value Date   PROLACTIN 7.8 01/07/2022   Lab Results  Component Value Date   CHOL 201 (H) 01/07/2022   TRIG 137 01/07/2022   HDL 47 01/07/2022   CHOLHDL 4.3 01/07/2022   VLDL 27 01/07/2022   LDLCALC 127 (H) 01/07/2022   LDLCALC 110 (H) 12/11/2020    Therapeutic Lab Levels: No results found for: "LITHIUM" No results found for: "VALPROATE" No results found for: "CBMZ"  Physical Findings   PHQ2-9    Flowsheet Row ED from 01/07/2022 in  [facility] Office Visit from 12/11/2020 in [facility]  PHQ-2 Total Score 1 0  PHQ-9 Total Score 5 --      Flowsheet Row ED from 01/07/2022 in [facility] [place] [facility] ED from 09/18/2021 in [facility] ED from 07/16/2021 in [facility] EMERGENCY DEPARTMENT  C-SSRS RISK CATEGORY No Risk No Risk No Risk        Musculoskeletal  Strength & Muscle Tone: within normal limits Gait & Station: normal Patient leans: N/A  Psychiatric Specialty Exam  Presentation  General Appearance:  Appropriate for Environment; Casual  Eye Contact: Good  Speech: Clear and Coherent; Normal Rate  Speech Volume: Normal  Handedness: Right   Mood and Affect  Mood: Euthymic  Affect: Congruent; Appropriate   Thought Process  Thought Processes: Coherent; Goal Directed; Linear  Descriptions of Associations:Intact  Orientation:Full (Time, Place and Person)  Thought Content:Logical; WDL     Hallucinations: none   Ideas of Reference:None  Suicidal Thoughts: none   Homicidal Thoughts: none    Sensorium  Memory: Immediate Fair; Recent Fair  Judgment: Fair  Insight: Fair   Art therapist  Concentration: Good  Attention Span: Good  Recall: Good  Fund of Knowledge: Good  Language: Good   Psychomotor Activity  Psychomotor Activity: normal    Assets  Assets: Communication Skills; Desire for Improvement; Resilience; Physical Health   Sleep  Sleep: normal   Physical Exam  Physical Exam Constitutional:      Appearance: the patient is not toxic-appearing.  Pulmonary:     Effort: Pulmonary effort is normal.  Neurological:     General: No focal deficit present.     Mental Status: the patient is alert and oriented to person, place, and time.   Review of Systems  Respiratory:  Negative for shortness of breath.   Cardiovascular:   Negative for  chest pain.  Gastrointestinal:  Negative for abdominal pain, constipation, diarrhea, nausea and vomiting.  Neurological:  Negative for headaches.   Blood pressure 126/75, pulse 72, temperature 98 F (36.7 C), temperature source Oral, resp. rate 17, SpO2 100 %. There is no height or weight on file to calculate BMI.  Treatment Plan Summary: Daily contact with patient to assess and evaluate symptoms and progress in treatment and Medication management  Status: Voluntary  Continue home Keppra 500 mg twice daily   Lab work unremarkable  Disposition: Discharge tomorrow  Carlyn ReichertNick Adriaan Maltese, MD 01/13/2022 6:13 PM

## 2022-01-13 NOTE — ED Notes (Signed)
Pt is in the bed sleeping. Respirations are even and unlabored. No acute distress noted. Will continue to monitor for safety. 

## 2022-01-13 NOTE — Tx Team (Signed)
Treatment Team Meeting   Dr. Alvie Heidelberg and LCSW met with the pt to assess his current mood and affect, physical state, and discuss the treatment plan for discharge.  Aisa #[id number] was used to translate the conversation.    LCSW provided the pt with another inpatient option, [facility], but pt declined being referred there as he stated it would just prolong his search for employment and housing.  LCSW explained to the pt that the treatment set-up at [facility] would be different than what he has been exposed to here in the [facility], and it is geared more towards learning about substance use and addiction, coping skills for distress tolerance, etc. LCSW inquired pt regarding his plan for relapse prevention so that he can avoid more legal trouble.  Pt stated he did not have a plan and had not been thinking about it.  He stated that all of his friends drink and it would be hard to avoid it.  Pt appeared to present with poor insight and poor judgment to make another attempt at inpatient treatment.  He stated that he is aware that he needs treatment, but he also stated that he is more concerned about finding housing and employment so he can live on his own.  LCSW provided pt with phone numbers to [facility] in [place] and encouraged him to contact them for availability.  LCSW also informed him he would be provided assistance in speaking with the intake coordinators to do a pre-screening/screening/assessment over the phone.   Omelia Blackwater, MSW, [place] [facility] [phone] phone [phone] work mobile

## 2022-01-13 NOTE — ED Notes (Addendum)
Pt is in the dayroom watching TV.  Respirations are even and unlabored. No acute distress noted. Will continue to monitor for safety. 

## 2022-01-13 NOTE — ED Notes (Signed)
Patient is calm and cooperative with care and meds.  He did not want breakfast this morning.  He is now awake and speaking with M.D.  Will monitor and provide supportive environment

## 2022-01-14 DIAGNOSIS — Z79899 Other long term (current) drug therapy: Secondary | ICD-10-CM | POA: Diagnosis not present

## 2022-01-14 DIAGNOSIS — F1090 Alcohol use, unspecified, uncomplicated: Secondary | ICD-10-CM | POA: Diagnosis not present

## 2022-01-14 DIAGNOSIS — F1010 Alcohol abuse, uncomplicated: Secondary | ICD-10-CM | POA: Diagnosis not present

## 2022-01-14 DIAGNOSIS — Z5900 Homelessness unspecified: Secondary | ICD-10-CM | POA: Diagnosis not present

## 2022-01-14 DIAGNOSIS — G40909 Epilepsy, unspecified, not intractable, without status epilepticus: Secondary | ICD-10-CM | POA: Diagnosis not present

## 2022-01-14 MED ORDER — LEVETIRACETAM 500 MG PO TABS: 500.0000 mg | ORAL_TABLET | Freq: Two times a day (BID) | ORAL | 0 refills | Status: DC

## 2022-01-14 NOTE — Discharge Instructions (Addendum)
SUBSTANCE USE TREATMENT for Medicaid and Council  [facility]) [place], [place], [address] [phone] phone NOTE: [facility] is no longer offering IOP services.  Serves those who are low-income or have no insurance.  [facility] [address], [place], [place], [address] [phone] phone [phone] fax NOTE: Does have Substance Abuse-Intensive Outpatient Program [facility]) as well as transitional housing if eligible.  [place], [place], [address] [phone] phone [phone] fax  [place] [phone] [place], [place], [address] [phone] phone [phone] fax  [facility] (men only at this campus) [place], [place] [address] [phone]  ((These programs listed above have a one-time application fee.))  [facility] [address], [place] [address] [phone]  Dorado McRae [address], [place] [address] [phone] II Charna Busman [address], [place] [address] [phone], [place] [address] [phone]  [facility] (Rehab for men only) [address] [place], [place] [address] [phone]  [place] HOUSES:  [place] [phone]  [email or web address].oxfordvacancies.[email or web address]  12 STEP PROGRAMS:  Alcoholics Anonymous of [facility] ReportZoo.com.cy  Shelters [facility] [place], [place] [address] [place] [address], [place], [place] [address] [phone] Population served: Adult men & women (38 years old and older, able to perform activities for daily living) Documents required: Valid ID & Social Security Card  [facility] [address]  [place], [place] [address] [phone] Population served: Males 18+ Documents required: Valid ID & Social Security [facility] [facility] [place], [place], [place] [address] [phone] Population Served: Families with children, adult women, and adult men.

## 2022-01-14 NOTE — Discharge Planning (Signed)
LCSW went and spoke with patient this morning regarding discharge plans. Patient was observed lying in bed, however when prompted he sat up and spoke with LCSW. LCSW revisited the conversation regarding the previous attempts to find him placement, however those options were declined by the patient. Patient was informed that LCSW was able to secure an intensive outpatient substance abuse appt with [facility] for 01/19/2022 at 2:00pm. Patient reports he should be able to make the appt and will travel by bus. Patient was also informed of the various resources provided in his after visit summary for local shelters, substance abuse programs for outpatient and residential placement, and AA meetings. Patient expressed understanding and stated he will likely have a hard time finding shelter placement at this time. LCSW advised patient to follow up with the [facility] regarding bed availability and patient agreed. No other needs were reported by the patient at this time. LCSW will sign off at this time and inform assigned staff of plan.   Please re-consult if further SW needs arise prior to discharge.   Sean [name], [place] Social Worker [place] Ph: [phone]

## 2022-01-14 NOTE — ED Notes (Signed)
Pt is in the bed sleeping. Respirations are even and unlabored. No acute distress noted. Will continue to monitor for safety. 

## 2022-01-14 NOTE — ED Notes (Signed)
Pt resting quietly.  No complaint of pain, SI, HI, or AVH.  Breathing is even and unlabored.  Will continue to monitor for safety.

## 2022-01-14 NOTE — ED Provider Notes (Signed)
FBC/OBS ASAP Discharge Summary  Date and Time: 01/14/2022 6:32 PM  Name: Sean [name]  MRN:  [id number]   Discharge Diagnoses:  Final diagnoses:  Alcohol use disorder    Subjective:  Sean [name] is a 21 year old male with a past psychiatric history of alcohol use disorder and seizure disorder.  The patient receives frequent care in the emergency department and has had a limited follow-up with outpatient providers.  He is currently on Keppra 500 mg twice daily for seizures.  The patient presented to the [facility] behavioral health urgent care with request for detox and placement in residential rehab.  LCSW from jail Caroll Rancher, [phone] has been contacted frequently.  She reports that the patient will be able to discharge without notification to [facility] office.  She states this was decided with the patient's attorney in the jail.  Stay Summary:  In normal separate was put into finding residential rehab for the patient.  Unfortunately he was turned down from several rehabs based on his seizure disorder.  The patient declined numerous sober living arrangements because he felt like not being paid for work is like being stolen from.  The patient was discharged with bus passes to go to the [facility].  CD IOP appointment was obtained for him at the [facility] for 10/23 at 2 PM.  The patient was informed about this appointment.  Attempted to send the patient's medications, namely Keppra, to his pharmacy of choice.  They stated they were unable to see this patient's Medicaid in their system and it is likely that the patient's Medicaid is inactive.  Discussed situation with registered pharmacist, and she agreed to provide the patient with a 7-day supply of Keppra.  The patient was given a printed prescription for 30-day supply of Keppra on top of this.  The patient was told that it is vital for him to follow-up with his neurologist, Dr. April Manson.   The patient denies  experiencing suicidal or homicidal thoughts in the days leading up to discharge.  He demonstrated good behavioral and emotional regulation.  Total Time spent with patient: 30 minutes  Past Psychiatric History: as above Past Medical History:  Past Medical History:  Diagnosis Date   Alcohol abuse    Seizures ([place])    No past surgical history on file. Family History: No family history on file. Family Psychiatric History: per H and P Social History:  Social History   Substance and Sexual Activity  Alcohol Use Yes   Alcohol/week: 3.0 standard drinks of alcohol   Types: 3 Cans of beer per week   Comment: 3 drinks a day.     Social History   Substance and Sexual Activity  Drug Use Never    Social History   Socioeconomic History   Marital status: Single    Spouse name: Not on file   Number of children: Not on file   Years of education: Not on file   Highest education level: Not on file  Occupational History   Not on file  Tobacco Use   Smoking status: Never   Smokeless tobacco: Never  Vaping Use   Vaping Use: Never used  Substance and Sexual Activity   Alcohol use: Yes    Alcohol/week: 3.0 standard drinks of alcohol    Types: 3 Cans of beer per week    Comment: 3 drinks a day.   Drug use: Never   Sexual activity: Not Currently  Other Topics Concern   Not on file  Social History Narrative   Live alone in his apartment.    Social Determinants of Health   Financial Resource Strain: Not on file  Food Insecurity: Not on file  Transportation Needs: Not on file  Physical Activity: Not on file  Stress: Not on file  Social Connections: Not on file   SDOH:  SDOH Screenings   Depression (PHQ2-9): Medium Risk (01/10/2022)  Tobacco Use: Low Risk  (09/18/2021)    Tobacco Cessation:  N/A, patient does not currently use tobacco products  Current Medications:  No current facility-administered medications for this encounter.   Current Outpatient Medications   Medication Sig Dispense Refill   levETIRAcetam (KEPPRA) 500 MG tablet Take 1 tablet (500 mg total) by mouth 2 (two) times daily. 60 tablet 0    PTA Medications: (Not in a hospital admission)      01/10/2022    7:21 AM 01/10/2022    7:06 AM 12/11/2020   11:21 AM  Depression screen PHQ 2/9  Decreased Interest 0 2 0  Down, Depressed, Hopeless 1 1 0  PHQ - 2 Score 1 3 0  Altered sleeping 1 0   Tired, decreased energy 0 2   Change in appetite 1 1   Feeling bad or failure about yourself  0 0   Trouble concentrating 2 0   Moving slowly or fidgety/restless 0 0   Suicidal thoughts 0 0   PHQ-9 Score 5 6   Difficult doing work/chores Very difficult Somewhat difficult     Flowsheet Row ED from 01/07/2022 in [facility] ED from 09/18/2021 in [facility] ED from 07/16/2021 in [facility] EMERGENCY DEPARTMENT  C-SSRS RISK CATEGORY No Risk No Risk No Risk       Musculoskeletal  Strength & Muscle Tone: within normal limits Gait & Station: normal Patient leans: N/A  Psychiatric Specialty Exam  Presentation  General Appearance:  Appropriate for Environment; Casual  Eye Contact: Good  Speech: Clear and Coherent; Normal Rate  Speech Volume: Normal  Handedness: Right   Mood and Affect  Mood: Euthymic  Affect: Congruent; Appropriate   Thought Process  Thought Processes: Coherent; Goal Directed; Linear  Descriptions of Associations:Intact  Orientation:Full (Time, Place and Person)  Thought Content:Logical; WDL  Diagnosis of Schizophrenia or Schizoaffective disorder in past: No    Hallucinations: none Ideas of Reference:None  Suicidal Thoughts: none Homicidal Thoughts: none  Sensorium  Memory: Immediate Fair; Recent Fair  Judgment: Fair  Insight: Fair   Art therapist  Concentration: Good  Attention Span: Good  Recall: Good  Fund of  Knowledge: Good  Language: Good   Psychomotor Activity  Psychomotor Activity:No data recorded  Assets  Assets: Communication Skills; Desire for Improvement; Resilience; Physical Health   Sleep  Sleep: none  Physical Exam  Physical Exam Constitutional:      Appearance: the patient is not toxic-appearing.  Pulmonary:     Effort: Pulmonary effort is normal.  Neurological:     General: No focal deficit present.     Mental Status: the patient is alert and oriented to person, place, and time.   Review of Systems  Respiratory:  Negative for shortness of breath.   Cardiovascular:  Negative for chest pain.  Gastrointestinal:  Negative for abdominal pain, constipation, diarrhea, nausea and vomiting.  Neurological:  Negative for headaches.   Blood pressure 121/71, pulse 77, temperature 98.7 F (37.1 C), temperature source Tympanic, resp. rate 18, SpO2 100 %. There is no height  or weight on file to calculate BMI.  Demographic Factors:  Low socioeconomic status  Loss Factors: NA  Historical Factors: NA  Risk Reduction Factors:   Positive therapeutic relationship and Positive coping skills or problem solving skills  Continued Clinical Symptoms:  Alcohol/Substance Abuse/Dependencies  Cognitive Features That Contribute To Risk:  None    Suicide Risk:  Mild: The patient presented with no identifiable suicidal ideation.  During his stay he has demonstrated good behavioral and emotional regulation.  Plan Of Care/Follow-up recommendations:  Activity: as tolerated  Diet: heart healthy  Other: -Follow-up with your outpatient psychiatric provider - information provided in the discharge instructions.   -Take your psychiatric medications as prescribed at discharge - instructions are provided to you in the discharge paperwork.  -Follow-up with outpatient primary care doctor and other specialists -for management of preventative medicine and chronic medical disease.    -Recommend abstinence from alcohol, tobacco, and other illicit drug use at discharge.   -If your psychiatric symptoms recur, worsen, or if you have side effects to your psychiatric medications, call your outpatient psychiatric provider, 911, [phone] or go to the nearest emergency department.   Disposition: self-care  Carlyn Reichert, MD 01/14/2022, 6:32 PM

## 2022-01-19 ENCOUNTER — Ambulatory Visit (HOSPITAL_COMMUNITY): Payer: Medicaid Other | Admitting: Licensed Clinical Social Worker

## 2022-03-14 ENCOUNTER — Emergency Department (HOSPITAL_COMMUNITY)
Admission: EM | Admit: 2022-03-14 | Discharge: 2022-03-14 | Disposition: A | Discharge status: Home / Self Care | Payer: Medicaid Other | Attending: Emergency Medicine | Admitting: Emergency Medicine

## 2022-03-14 ENCOUNTER — Emergency Department (HOSPITAL_COMMUNITY): Payer: Medicaid Other

## 2022-03-14 ENCOUNTER — Other Ambulatory Visit: Payer: Self-pay

## 2022-03-14 ENCOUNTER — Encounter (HOSPITAL_COMMUNITY): Payer: Self-pay

## 2022-03-14 DIAGNOSIS — Y908 Blood alcohol level of 240 mg/100 ml or more: Secondary | ICD-10-CM | POA: Insufficient documentation

## 2022-03-14 DIAGNOSIS — F10129 Alcohol abuse with intoxication, unspecified: Secondary | ICD-10-CM | POA: Diagnosis present

## 2022-03-14 DIAGNOSIS — R569 Unspecified convulsions: Secondary | ICD-10-CM

## 2022-03-14 DIAGNOSIS — E876 Hypokalemia: Secondary | ICD-10-CM | POA: Diagnosis not present

## 2022-03-14 DIAGNOSIS — F10920 Alcohol use, unspecified with intoxication, uncomplicated: Secondary | ICD-10-CM

## 2022-03-14 LAB — SALICYLATE LEVEL: Salicylate Lvl: <7 mg/dL — ABNORMAL LOW (ref 7.0–30.0)

## 2022-03-14 LAB — I-STAT CHEM 8, ED
BUN: 6 mg/dL (ref 6–20)
Calcium, Ion: 1.1 mmol/L — ABNORMAL LOW (ref 1.15–1.40)
Chloride: 105 mmol/L (ref 98–111)
Creatinine, Ser: 1.1 mg/dL (ref 0.61–1.24)
Glucose, Bld: 118 mg/dL — ABNORMAL HIGH (ref 70–99)
HCT: 42 % (ref 39.0–52.0)
Hemoglobin: 14.3 g/dL (ref 13.0–17.0)
Potassium: 3.3 mmol/L — ABNORMAL LOW (ref 3.5–5.1)
Sodium: 146 mmol/L — ABNORMAL HIGH (ref 135–145)
TCO2: 25 mmol/L (ref 22–32)

## 2022-03-14 LAB — CBC WITH DIFFERENTIAL/PLATELET
Abs Immature Granulocytes: 0.01 10*3/uL (ref 0.00–0.07)
Basophils Absolute: 0 10*3/uL (ref 0.0–0.1)
Basophils Relative: 0 %
Eosinophils Absolute: 0.1 10*3/uL (ref 0.0–0.5)
Eosinophils Relative: 2 %
HCT: 42.7 % (ref 39.0–52.0)
Hemoglobin: 15.3 g/dL (ref 13.0–17.0)
Immature Granulocytes: 0 %
Lymphocytes Relative: 53 %
Lymphs Abs: 4.4 10*3/uL — ABNORMAL HIGH (ref 0.7–4.0)
MCH: 30.9 pg (ref 26.0–34.0)
MCHC: 35.8 g/dL (ref 30.0–36.0)
MCV: 86.3 fL (ref 80.0–100.0)
Monocytes Absolute: 0.7 10*3/uL (ref 0.1–1.0)
Monocytes Relative: 9 %
Neutro Abs: 2.9 10*3/uL (ref 1.7–7.7)
Neutrophils Relative %: 36 %
Platelets: 236 10*3/uL (ref 150–400)
RBC: 4.95 MIL/uL (ref 4.22–5.81)
RDW: 13.1 % (ref 11.5–15.5)
WBC: 8.2 10*3/uL (ref 4.0–10.5)
nRBC: 0 % (ref 0.0–0.2)

## 2022-03-14 LAB — COMPREHENSIVE METABOLIC PANEL
ALT: 67 U/L — ABNORMAL HIGH (ref 0–44)
AST: 44 U/L — ABNORMAL HIGH (ref 15–41)
Albumin: 4.7 g/dL (ref 3.5–5.0)
Alkaline Phosphatase: 91 U/L (ref 38–126)
Anion gap: 11 (ref 5–15)
BUN: 6 mg/dL (ref 6–20)
CO2: 26 mmol/L (ref 22–32)
Calcium: 9.3 mg/dL (ref 8.9–10.3)
Chloride: 108 mmol/L (ref 98–111)
Creatinine, Ser: 0.77 mg/dL (ref 0.61–1.24)
GFR, Estimated: >60 mL/min (ref >60)
Glucose, Bld: 118 mg/dL — ABNORMAL HIGH (ref 70–99)
Potassium: 3.3 mmol/L — ABNORMAL LOW (ref 3.5–5.1)
Sodium: 145 mmol/L (ref 135–145)
Total Bilirubin: 0.9 mg/dL (ref 0.3–1.2)
Total Protein: 7.9 g/dL (ref 6.5–8.1)

## 2022-03-14 LAB — RAPID URINE DRUG SCREEN, HOSP PERFORMED
Amphetamines: NOT DETECTED
Barbiturates: NOT DETECTED
Benzodiazepines: NOT DETECTED
Cocaine: NOT DETECTED
Opiates: NOT DETECTED
Tetrahydrocannabinol: NOT DETECTED

## 2022-03-14 LAB — CBG MONITORING, ED: Glucose-Capillary: 131 mg/dL — ABNORMAL HIGH (ref 70–99)

## 2022-03-14 LAB — LACTIC ACID, PLASMA: Lactic Acid, Venous: 2.9 mmol/L (ref 0.5–1.9)

## 2022-03-14 LAB — TROPONIN I (HIGH SENSITIVITY): Troponin I (High Sensitivity): 4 ng/L (ref <18)

## 2022-03-14 LAB — ETHANOL: Alcohol, Ethyl (B): 298 mg/dL — ABNORMAL HIGH (ref <10)

## 2022-03-14 MED ORDER — LEVETIRACETAM 500 MG PO TABS
500.0000 mg | ORAL_TABLET | Freq: Two times a day (BID) | ORAL | 0 refills | Status: DC
  Filled 2022-03-14: qty 60, 30d supply, fill #0

## 2022-03-14 MED ORDER — ONDANSETRON HCL 4 MG/2ML IJ SOLN
INTRAMUSCULAR | Status: AC
  Administered 2022-03-14: 4 mg via INTRAVENOUS
  Filled 2022-03-14: qty 2

## 2022-03-14 MED ORDER — ONDANSETRON HCL 4 MG/2ML IJ SOLN: 4.0000 mg | Freq: Once | INTRAMUSCULAR | Status: AC

## 2022-03-14 MED ORDER — LACTATED RINGERS IV BOLUS
1000.0000 mL | Freq: Once | INTRAVENOUS | Status: AC
  Administered 2022-03-14: 1000 mL via INTRAVENOUS

## 2022-03-14 MED ORDER — NALOXONE HCL 0.4 MG/ML IJ SOLN: 0.4000 mg | Freq: Once | INTRAMUSCULAR | Status: DC

## 2022-03-14 MED ORDER — LEVETIRACETAM 500 MG PO TABS: 500.0000 mg | ORAL_TABLET | Freq: Two times a day (BID) | ORAL | 0 refills | Status: DC

## 2022-03-14 MED ORDER — NALOXONE HCL 0.4 MG/ML IJ SOLN
INTRAMUSCULAR | Status: AC
  Filled 2022-03-14: qty 1

## 2022-03-14 MED ORDER — LEVETIRACETAM IN NACL 1000 MG/100ML IV SOLN
1000.0000 mg | Freq: Once | INTRAVENOUS | Status: AC
  Administered 2022-03-14: 1000 mg via INTRAVENOUS
  Filled 2022-03-14: qty 100

## 2022-03-14 NOTE — ED Notes (Signed)
Pt refuse repeat lactic and troponin to be drawn

## 2022-03-14 NOTE — ED Notes (Signed)
Returned from ct at this time ?

## 2022-03-14 NOTE — ED Notes (Signed)
ED provider at bedside.

## 2022-03-14 NOTE — ED Notes (Signed)
Pt is looking for is orange bag which he said he brought it with him. States, "I have my social security card there." RN told pt he walked in intoxicated and unresponsive initially and pt did not come by ambulance. Pt is not listening and keeps on asking for his bag. RN checked purple lockers and trauma b where pt was initially roomed in. Unable to find pt bag. Pt cellphone and wallet returned to pt at this time including clothes. Pt was given a printed prescription of keppra and a bus pass. Waiting for security to escort pt as pt refuses to leave at this time. Will continue to monitor.

## 2022-03-14 NOTE — ED Notes (Signed)
Spoke to interpreter

## 2022-03-14 NOTE — ED Notes (Signed)
Attempting to answer questions for assessment with the pt and the interpreter however pt is continuously answering with religion statements. Attempted multiple times and he continued to do so. Pt is aware that we do need a UA sample from him and provided a urinal

## 2022-03-14 NOTE — ED Notes (Signed)
Awake and talking but confused

## 2022-03-14 NOTE — ED Triage Notes (Signed)
Pt brought back from the waiting room. Pt became unresponsive and not responding to painful stimuli. ER sort nurse noted pt jaw clenched, foaming at the mouth and vomiting. On arrival to waiting room pt advised [facility] EMT that he didn't want to see a doctor just wanted to sleep because he was drunk and did some drugs. Unknown if actual seizxure

## 2022-03-14 NOTE — ED Notes (Signed)
Pt requesting interpreter. Pt is awake and speaking in [place] and [place]ahili. He is A&Ox4. Per pt he came to the ED because he needs assistance and he isn't getting the assistance that he needs with his problem. Per pt no place to stay and he wants [place] to help himj find one. Pt was advised that we do not do that in the ED. Pt then stated that he needs help to stop drinking, assistance to get him a job and get him on his feet.  He states that he drinks everyday unknown amount and denies any drug use

## 2022-03-14 NOTE — ED Notes (Signed)
Spoke to the interpreter to go over discharge paper with patient.

## 2022-03-14 NOTE — ED Notes (Signed)
Pt is now awake, alert and oriented. Pt started putting on his clothes and states he is leaving soon. Provider notified and is currently talking to pt. Currently voluntary and waiting for TTS dispo.

## 2022-03-14 NOTE — Discharge Instructions (Addendum)
You have been seen today for your complaint of alcohol intoxication. Please seek immediate medical care if you develop any of the following symptoms: You have thoughts of harming yourself or others you start getting At this time there does not appear to be the presence of an emergent medical condition, however there is always the potential for conditions to change. Please read and follow the below instructions.  Do not take your medicine if  develop an itchy rash, swelling in your mouth or lips, or difficulty breathing; call 911 and seek immediate emergency medical attention if this occurs.  You may review your lab tests and imaging results in their entirety on your MyChart account.  Please discuss all results of fully with your primary care provider and other specialist at your follow-up visit.  Note: Portions of this text may have been transcribed using voice recognition software. Every effort was made to ensure accuracy; however, inadvertent computerized transcription errors may still be present.

## 2022-03-14 NOTE — ED Provider Notes (Signed)
[facility] EMERGENCY DEPARTMENT Provider Note   CSN: [id number] Arrival date & time: 03/14/22  0257     History  Chief Complaint  Patient presents with   Loss of Consciousness    Sean [name] is a 21 y.o. male who walked into the waiting room reporting to a tech that he was drunk but did not want to be seen by Dr., Did not check into the emergency department.  Sort RN was informed by bystanders that patient had vomited.  When she presented to check in the patient the waiting room he was unresponsive with his jaw clenched, vomiting.  No generalized tonic-clonic seizure-like activity witnessed by ED staff.  Patient not compliant with his Keppra, history of seizure-like disorder and homelessness.  Patient initially unresponsive even to painful stimuli but with normal vital signs.  Subsequently woke up and began crying loudly then fell back asleep.  Mental status improved after Narcan administration, started talking about how he presented to the emergency department because he wants help with his alcohol "taking my freedom, God wants me to have my freedom".  Hyperreligious speech, difficult to redirect, history collected with the assistance of Swahili interpreter.    HPI     Home Medications Prior to Admission medications   Medication Sig Start Date End Date Taking? Authorizing Provider  levETIRAcetam (KEPPRA) 500 MG tablet Take 1 tablet (500 mg total) by mouth 2 (two) times daily. 01/14/22 04/14/22  Carlyn Reichert, MD      Allergies    Patient has no known allergies.    Review of Systems   Review of Systems  Unable to perform ROS: Mental status change    Physical Exam Updated Vital Signs BP 109/71   Pulse 79   Resp (!) 0   Ht 5\' 8"  (1.727 m)   Wt 72.2 kg   SpO2 98%   BMI 24.20 kg/m  Physical Exam Vitals and nursing note reviewed.  Constitutional:      Appearance: He is not ill-appearing or toxic-appearing.  HENT:     Head: Normocephalic and  atraumatic.     Mouth/Throat:     Mouth: Mucous membranes are moist.     Pharynx: No oropharyngeal exudate or posterior oropharyngeal erythema.  Eyes:     General:        Right eye: No discharge.        Left eye: No discharge.     Extraocular Movements: Extraocular movements intact.     Conjunctiva/sclera: Conjunctivae normal.     Pupils: Pupils are equal, round, and reactive to light.  Cardiovascular:     Rate and Rhythm: Normal rate and regular rhythm.     Pulses: Normal pulses.     Heart sounds: Normal heart sounds. No murmur heard. Pulmonary:     Effort: Pulmonary effort is normal. No respiratory distress.     Breath sounds: Normal breath sounds. No wheezing or rales.  Abdominal:     General: Bowel sounds are normal. There is no distension.     Palpations: Abdomen is soft.     Tenderness: There is no abdominal tenderness. There is no guarding or rebound.  Musculoskeletal:        General: No deformity.     Cervical back: Neck supple.     Right lower leg: No edema.     Left lower leg: No edema.  Skin:    General: Skin is warm and dry.     Capillary Refill: Capillary refill takes less than  2 seconds.  Neurological:     General: No focal deficit present.     Mental Status: He is alert and oriented to person, place, and time. Mental status is at baseline.     GCS: GCS eye subscore is 4. GCS verbal subscore is 4. GCS motor subscore is 6.  Psychiatric:        Mood and Affect: Mood normal. Affect is labile and tearful.        Speech: Speech normal.        Behavior: Behavior is agitated.        Thought Content: Thought content is not paranoid. Thought content does not include homicidal or suicidal ideation.     Comments: Appears intoxicated, hyperreligious speech. Does not appear to be responding to internal stimuli.      ED Results / Procedures / Treatments   Labs (all labs ordered are listed, but only abnormal results are displayed) Labs Reviewed  CBC WITH  DIFFERENTIAL/PLATELET - Abnormal; Notable for the following components:      Result Value   Lymphs Abs 4.4 (*)    All other components within normal limits  SALICYLATE LEVEL - Abnormal; Notable for the following components:   Salicylate Lvl <7.0 (*)    All other components within normal limits  ETHANOL - Abnormal; Notable for the following components:   Alcohol, Ethyl (B) 298 (*)    All other components within normal limits  COMPREHENSIVE METABOLIC PANEL - Abnormal; Notable for the following components:   Potassium 3.3 (*)    Glucose, Bld 118 (*)    AST 44 (*)    ALT 67 (*)    All other components within normal limits  LACTIC ACID, PLASMA - Abnormal; Notable for the following components:   Lactic Acid, Venous 2.9 (*)    All other components within normal limits  CBG MONITORING, ED - Abnormal; Notable for the following components:   Glucose-Capillary 131 (*)    All other components within normal limits  I-STAT CHEM 8, ED - Abnormal; Notable for the following components:   Sodium 146 (*)    Potassium 3.3 (*)    Glucose, Bld 118 (*)    Calcium, Ion 1.10 (*)    All other components within normal limits  LACTIC ACID, PLASMA  RAPID URINE DRUG SCREEN, HOSP PERFORMED  LEVETIRACETAM LEVEL  TROPONIN I (HIGH SENSITIVITY)  TROPONIN I (HIGH SENSITIVITY)    EKG EKG Interpretation  Date/Time:  Saturday March 14 2022 03:23:38 EST Ventricular Rate:  85 PR Interval:  207 QRS Duration: 100 QT Interval:  358 QTC Calculation: 426 R Axis:   54 Text Interpretation: Sinus rhythm Borderline prolonged PR interval Partial missing lead(s): V2 V3 V4 V5 V6 Confirmed by Nicanor Alcon, April ([id number]) on 03/14/2022 3:25:37 AM  Radiology CT HEAD WO CONTRAST ( )  Result Date: 03/14/2022 CLINICAL DATA:  21 year old male became unresponsive with jaw clenching, foaming at the mouth. EXAM: CT HEAD WITHOUT CONTRAST TECHNIQUE: Contiguous axial images were obtained from the base of the skull through the  vertex without intravenous contrast. RADIATION DOSE REDUCTION: This exam was performed according to the departmental dose-optimization program which includes automated exposure control, adjustment of the mA and/or kV according to patient size and/or use of iterative reconstruction technique. COMPARISON:  Head CT 07/16/2021. FINDINGS: Brain: Cerebral volume appears stable, within normal limits. No midline shift, ventriculomegaly, mass effect, evidence of mass lesion, intracranial hemorrhage or evidence of cortically based acute infarction. Gray-white matter differentiation is within normal limits throughout the  brain. Vascular: No suspicious intracranial vascular hyperdensity. Distal right vertebral artery likely is dominant. 6666 Skull: Negative. Sinuses/Orbits: Visualized paranasal sinuses and mastoids are stable and well aerated. Other: Visualized orbits and scalp soft tissues are within normal limits. IMPRESSION: Stable and normal noncontrast Head CT. Electronically Signed   By: Odessa Fleming M.D.   On: 03/14/2022 04:38    Procedures Procedures    Medications Ordered in ED Medications  naloxone [facility]) injection 0.4 mg ( Intravenous Not Given 03/14/22 0316)  ondansetron (ZOFRAN) injection 4 mg (4 mg Intravenous Given 03/14/22 0317)  levETIRAcetam (KEPPRA) IVPB 1000 mg/100 mL premix (0 mg Intravenous Stopped 03/14/22 0425)  lactated ringers bolus 1,000 mL (0 mLs Intravenous Stopped 03/14/22 2122)    ED Course/ Medical Decision Making/ A&P                           Medical Decision Making 21 year old male who present unresponsive.   Tachycardic and bradypneic on intake. VS otherwise normal.  The differential diagnosis for AMS is extensive and includes, but is not limited to:  Drug overdose - opioids, alcohol, sedatives, antipsychotics, drug withdrawal, others Metabolic: hypoxia, hypoglycemia, hyperglycemia, hypercalcemia, hypernatremia, hyponatremia, uremia, hepatic encephalopathy, hypothyroidism,  hyperthyroidism, vitamin B12 or thiamine deficiency, carbon monoxide poisoning, Wilson's disease, Lactic acidosis, DKA/HHOS Infectious: meningitis, encephalitis, bacteremia/sepsis, urinary tract infection, pneumonia, neurosyphilis Structural: Space-occupying lesion, (brain tumor, subdural hematoma, hydrocephalus,) Vascular: stroke, subarachnoid hemorrhage, coronary ischemia, hypertensive encephalopathy, CNS vasculitis, thrombotic thrombocytopenic purpura, disseminated intravascular coagulation, hyperviscosity Psychiatric: Schizophrenia, depression; Other: Seizure, hypothermia, heat stroke, ICU psychosis, dementia -"sundowning."    Amount and/or Complexity of Data Reviewed Labs: ordered.    Details: CBC without leukocytosis or anemia, CMP with hypokalemia of 3.3, transaminitis with AST/ALT 44/67.  Lactic acid elevated to 2.9, troponin negative.  Alcohol elevated to 298.  CBG normal.   Radiology: ordered.    Details: CT head negative for acute cranial abnormality.   ECG/medicine tests:     Details: EKG as above with normal sinus rhythm.  No STEMI.   Risk Prescription drug management.   Patient received IV fluids, refused any further laboratory studies, care of patient from ED provider Hilton Cork, PA-C at time of shift change.  All pertinent HPI, exam and laboratory findings were discussed with him prior to my departure.  Disposition pending TTS evaluation and reevaluation.  This chart was dictated using voice recognition software, Dragon. Despite the best efforts of this provider to proofread and correct errors, errors may still occur which can change documentation meaning.    Final Clinical Impression(s) / ED Diagnoses Final diagnoses:  None    Rx / DC Orders ED Discharge Orders     None         Sherrilee Gilles 03/14/22 [id number]    Palumbo, April, MD 03/15/22 [phone]

## 2022-03-14 NOTE — ED Provider Notes (Signed)
Physical Exam  BP 124/70   Pulse 81   Resp 13   Ht 5\' 8"  (1.727 m)   Wt 72.2 kg   SpO2 99%   BMI 24.20 kg/m   Physical Exam Vitals and nursing note reviewed.  Constitutional:      General: He is not in acute distress.    Appearance: Normal appearance. He is normal weight. He is not ill-appearing.     Comments: Sitting up at the edge of the bed.  HENT:     Head: Normocephalic and atraumatic.  Pulmonary:     Effort: Pulmonary effort is normal. No respiratory distress.  Abdominal:     General: Abdomen is flat.  Musculoskeletal:        General: Normal range of motion.     Cervical back: Neck supple.  Skin:    General: Skin is warm and dry.  Neurological:     General: No focal deficit present.     Mental Status: He is alert and oriented to person, place, and time.  Psychiatric:        Mood and Affect: Mood normal.        Behavior: Behavior normal.        Thought Content: Thought content normal.        Judgment: Judgment normal.     Comments: No pressured speech, aggression, flight of ideas or other psychiatric abnormalities    Procedures  Procedures  ED Course / MDM   Clinical Course as of 03/14/22 1024  Sat Mar 14, 2022  0804 Patient is refusing labs [AS]    Clinical Course User Index [AS] Lucinda Spells, 0805, PA-C   Medical Decision Making Amount and/or Complexity of Data Reviewed Labs: ordered. Radiology: ordered.  Risk Prescription drug management.  Assumed care at shift change from [place], PA-C.  Please see her note for full HPI.  In short, patient is a 21 year old male who is struggling with alcohol abuse.  Presented to the ED front desk stating that he is severely intoxicated, but did not initially check in.  At shift change patient had an elevated lactate and has pressured religious speech.  Current plan is to trend lactate after fluid resuscitation and medically clear for TTS consult.  0804-patient refusing further labs.  States he just wants to  be seen by psychiatry and wants to detox from alcohol.  Previous provider believes lactic acidosis is likely related to acute alcohol use.  Will medically clear patient for TTS consult and place patient in psych hold.  1050-patient requesting to leave.  I was asked to speak to the patient at bedside.  Had a lengthy conversation regarding his wants.  He states that he drinks when he sees alcohol because he cannot control himself.  Never drinks to harm himself.  He adamantly denies suicidal or homicidal ideations.  Denies any auditory or visual hallucinations.  He appears sober at the time of my evaluation.  He is fully alert and oriented and able to make decisions for himself.  He has recently been released from jail and states that he is currently homeless.  He will be provided a 21 for homeless shelters, food pantries, and information regarding alcohol detoxification.  He states he does not want to drink anymore but cannot control himself when he has around alcohol.  I do not see any indication to IVC this patient as he is not a danger to himself or others.  He was strongly encouraged to return to the ED if  he does feel thoughts of harming himself or others.  He was declining labs in the ED.  He did have lactic acidosis and slight hypokalemia. He did have slightly elevated LFTs which is likely secondary to his acute intoxication.  He states he has been compliant with his Keppra.  He has 8 pills left.  Does not have any refills.  Will send a refill via paper prescription at patient request.  1330-patient repeatedly asking where his backpack is. States he believes he may have left it in the ambulance prior to arrival. Patient arrived to ED in private vehicle and did not use EMS. Refused to leave until his bag was found. Nurse Katrina searched the entire department and did not find it. Patient still refused to leave, security was contacted and escorted patient out without further incident.  At this  time there does not appear to be any evidence of an acute emergency medical condition and the patient appears stable for discharge with appropriate outpatient follow up. Diagnosis was discussed with patient who verbalizes understanding of care plan and is agreeable to discharge. I have discussed return precautions with patient who verbalizes understanding. Patient encouraged to follow-up with their PCP within 1 week. All questions answered.  Note: Portions of this report may have been transcribed using voice recognition software. Every effort was made to ensure accuracy; however, inadvertent computerized transcription errors may still be present.         Michelle Piper, PA-C 03/14/22 1414    Alvira Monday, MD 03/16/22 [phone]

## 2022-03-14 NOTE — ED Notes (Signed)
Fluids finished attempted to redraw labs per provider request and pt is currently refusing for labs to be drawn states he doesn't know how it is going to help him. ED provider aware of same at this time

## 2022-03-16 ENCOUNTER — Other Ambulatory Visit: Payer: Self-pay

## 2022-03-16 LAB — LEVETIRACETAM LEVEL: Levetiracetam Lvl: 5.3 ug/mL — ABNORMAL LOW (ref 10.0–40.0)

## 2022-03-17 ENCOUNTER — Other Ambulatory Visit: Payer: Self-pay

## 2022-03-18 ENCOUNTER — Telehealth: Payer: Self-pay

## 2022-03-18 NOTE — Telephone Encounter (Signed)
Transition Care Management Unsuccessful Follow-up Telephone Call  Date of discharge and from where:  03/14/22  Attempts:  1st Attempt  Reason for unsuccessful TCM follow-up call:  Unable to reach patient  Renelda Loma RMA

## 2022-04-04 ENCOUNTER — Emergency Department (HOSPITAL_COMMUNITY)
Admission: EM | Admit: 2022-04-04 | Discharge: 2022-04-05 | Disposition: A | Discharge status: Home / Self Care | Payer: Medicaid Other | Attending: Emergency Medicine | Admitting: Emergency Medicine

## 2022-04-04 ENCOUNTER — Encounter (HOSPITAL_COMMUNITY): Payer: Self-pay

## 2022-04-04 ENCOUNTER — Other Ambulatory Visit: Payer: Self-pay

## 2022-04-04 DIAGNOSIS — F10229 Alcohol dependence with intoxication, unspecified: Secondary | ICD-10-CM | POA: Insufficient documentation

## 2022-04-04 DIAGNOSIS — Z91148 Patient's other noncompliance with medication regimen for other reason: Secondary | ICD-10-CM | POA: Diagnosis not present

## 2022-04-04 DIAGNOSIS — E162 Hypoglycemia, unspecified: Secondary | ICD-10-CM | POA: Diagnosis present

## 2022-04-04 DIAGNOSIS — R569 Unspecified convulsions: Secondary | ICD-10-CM | POA: Diagnosis not present

## 2022-04-04 LAB — CBC WITH DIFFERENTIAL/PLATELET
Abs Immature Granulocytes: 0 10*3/uL (ref 0.00–0.07)
Basophils Absolute: 0 10*3/uL (ref 0.0–0.1)
Basophils Relative: 0 %
Eosinophils Absolute: 0.1 10*3/uL (ref 0.0–0.5)
Eosinophils Relative: 2 %
HCT: 43.9 % (ref 39.0–52.0)
Hemoglobin: 15.9 g/dL (ref 13.0–17.0)
Immature Granulocytes: 0 %
Lymphocytes Relative: 55 %
Lymphs Abs: 2.6 10*3/uL (ref 0.7–4.0)
MCH: 31.6 pg (ref 26.0–34.0)
MCHC: 36.2 g/dL — ABNORMAL HIGH (ref 30.0–36.0)
MCV: 87.3 fL (ref 80.0–100.0)
Monocytes Absolute: 0.3 10*3/uL (ref 0.1–1.0)
Monocytes Relative: 5 %
Neutro Abs: 1.8 10*3/uL (ref 1.7–7.7)
Neutrophils Relative %: 38 %
Platelets: 174 10*3/uL (ref 150–400)
RBC: 5.03 MIL/uL (ref 4.22–5.81)
RDW: 13 % (ref 11.5–15.5)
WBC: 4.7 10*3/uL (ref 4.0–10.5)
nRBC: 0 % (ref 0.0–0.2)

## 2022-04-04 LAB — COMPREHENSIVE METABOLIC PANEL
ALT: 82 U/L — ABNORMAL HIGH (ref 0–44)
AST: 62 U/L — ABNORMAL HIGH (ref 15–41)
Albumin: 4.3 g/dL (ref 3.5–5.0)
Alkaline Phosphatase: 125 U/L (ref 38–126)
Anion gap: 13 (ref 5–15)
BUN: 13 mg/dL (ref 6–20)
CO2: 27 mmol/L (ref 22–32)
Calcium: 9.3 mg/dL (ref 8.9–10.3)
Chloride: 100 mmol/L (ref 98–111)
Creatinine, Ser: 0.78 mg/dL (ref 0.61–1.24)
GFR, Estimated: >60 mL/min (ref >60)
Glucose, Bld: 121 mg/dL — ABNORMAL HIGH (ref 70–99)
Potassium: 3.7 mmol/L (ref 3.5–5.1)
Sodium: 140 mmol/L (ref 135–145)
Total Bilirubin: 0.6 mg/dL (ref 0.3–1.2)
Total Protein: 8.9 g/dL — ABNORMAL HIGH (ref 6.5–8.1)

## 2022-04-04 LAB — CBG MONITORING, ED: Glucose-Capillary: 135 mg/dL — ABNORMAL HIGH (ref 70–99)

## 2022-04-04 LAB — MAGNESIUM: Magnesium: 2.2 mg/dL (ref 1.7–2.4)

## 2022-04-04 LAB — LIPASE, BLOOD: Lipase: 40 U/L (ref 11–51)

## 2022-04-04 MED ORDER — LEVETIRACETAM 500 MG PO TABS
1000.0000 mg | ORAL_TABLET | Freq: Once | ORAL | Status: AC
  Administered 2022-04-05: 1000 mg via ORAL
  Filled 2022-04-04: qty 2

## 2022-04-04 MED ORDER — THIAMINE HCL 100 MG PO TABS
100.0000 mg | ORAL_TABLET | Freq: Once | ORAL | Status: AC
  Administered 2022-04-05: 100 mg via ORAL
  Filled 2022-04-04 (×2): qty 1

## 2022-04-04 NOTE — ED Notes (Addendum)
Pt. Refuses labs and IV

## 2022-04-04 NOTE — ED Notes (Signed)
Pt. States, "My blood is not for free. If you take my blood you need to help me."

## 2022-04-04 NOTE — ED Triage Notes (Signed)
Pt. BIB [facility] for hypoglycemia and hypertension. Pt. CBG on EMS arrival was 64. Oral glucose given en route, Pt. Recheck CBG was 100. Pt. Had blood pressure of 194/132. Pt. Also asking for help for his alcoholism.

## 2022-04-04 NOTE — ED Provider Notes (Signed)
[facility] DEPT Provider Note: Georgena Spurling, MD, FACEP  CSN: [id number] MRN: [id number] ARRIVAL: 04/04/22 at 2203 ROOM: [place]  Hypoglycemia   HISTORY OF PRESENT ILLNESS  04/04/22 11:45 PM Sean [name] is a 22 y.o. male with alcohol and manage seizure disorder.  He was brought by [facility] after he reportedly fell.  He cannot tell me if this was a seizure or if he just fell.  He denies injury.  EMS found him to have a sugar of 64 and he was given oral glucose.  He has subsequently been fed in the ED.  Recheck sugar was initially 100 and later 135.  His initial blood pressure was 194/132 but on recheck it is normal at 138/84.  He tells me he is here because he is homeless and has nowhere to go.  He denies suicidal ideation.  He states he is not on his Keppra.   Past Medical History:  Diagnosis Date   Alcohol abuse    Seizures ([place])     History reviewed. No pertinent surgical history.  History reviewed. No pertinent family history.  Social History   Tobacco Use   Smoking status: Never   Smokeless tobacco: Never  Vaping Use   Vaping Use: Never used  Substance Use Topics   Alcohol use: Yes    Alcohol/week: 3.0 standard drinks of alcohol    Types: 3 Cans of beer per week    Comment: 3 drinks a day.   Drug use: Never    Prior to Admission medications   Medication Sig Start Date End Date Taking? Authorizing Provider  levETIRAcetam (KEPPRA) 500 MG tablet Take 1 tablet (500 mg total) by mouth 2 (two) times daily. 04/05/22 05/05/22  Novak Stgermaine, Jenny Reichmann, MD    Allergies Patient has no known allergies.   REVIEW OF SYSTEMS  Negative except as noted here or in the History of Present Illness.   PHYSICAL EXAMINATION  Initial Vital Signs Blood pressure 138/84, pulse (!) 103, temperature 98.3 F (36.8 C), temperature source Oral, resp. rate 15, SpO2 96 %.  Examination General: Well-developed, well-nourished male in no acute distress; appearance consistent  with age of record HENT: normocephalic; atraumatic; no bite mark on tongue Eyes: Normal appearance Neck: supple Heart: regular rate and rhythm Lungs: clear to auscultation bilaterally Abdomen: soft; nondistended; nontender; bowel sounds present Extremities: No deformity; full range of motion Neurologic: Awake, alert; motor function intact in all extremities and symmetric; no facial droop Skin: Warm and dry Psychiatric: Normal mood and affect   RESULTS  Summary of this visit's results, reviewed and interpreted by myself:   EKG Interpretation  Date/Time:  Saturday April 04 2022 22:17:37 EST Ventricular Rate:  91 PR Interval:  194 QRS Duration: 91 QT Interval:  327 QTC Calculation: 403 R Axis:   37 Text Interpretation: Sinus rhythm ST elev, probable normal early repol pattern No significant change was found Confirmed by Caily Rakers, Jenny Reichmann [id number]) on 04/04/2022 11:45:18 PM       Laboratory Studies: Results for orders placed or performed during the hospital encounter of 04/04/22 (from the past 24 hour(s))  POC CBG, ED     Status: Abnormal   Collection Time: 04/04/22 10:35 PM  Result Value Ref Range   Glucose-Capillary 135 (H) 70 - 99 mg/dL  Comprehensive metabolic panel     Status: Abnormal   Collection Time: 04/04/22 10:47 PM  Result Value Ref Range   Sodium 140 135 - 145 mmol/L   Potassium 3.7 3.5 -  5.1 mmol/L   Chloride 100 98 - 111 mmol/L   CO2 27 22 - 32 mmol/L   Glucose, Bld 121 (H) 70 - 99 mg/dL   BUN 13 6 - 20 mg/dL   Creatinine, Ser 7.03 0.61 - 1.24 mg/dL   Calcium 9.3 8.9 - 50.0 mg/dL   Total Protein 8.9 (H) 6.5 - 8.1 g/dL   Albumin 4.3 3.5 - 5.0 g/dL   AST 62 (H) 15 - 41 U/L   ALT 82 (H) 0 - 44 U/L   Alkaline Phosphatase 125 38 - 126 U/L   Total Bilirubin 0.6 0.3 - 1.2 mg/dL   GFR, Estimated >93 >81 mL/min   Anion gap 13 5 - 15  CBC with Differential     Status: Abnormal   Collection Time: 04/04/22 10:47 PM  Result Value Ref Range   WBC 4.7 4.0 - 10.5 K/uL    RBC 5.03 4.22 - 5.81 MIL/uL   Hemoglobin 15.9 13.0 - 17.0 g/dL   HCT 82.9 93.7 - 16.9 %   MCV 87.3 80.0 - 100.0 fL   MCH 31.6 26.0 - 34.0 pg   MCHC 36.2 (H) 30.0 - 36.0 g/dL   RDW 67.8 93.8 - 10.1 %   Platelets 174 150 - 400 K/uL   nRBC 0.0 0.0 - 0.2 %   Neutrophils Relative % 38 %   Neutro Abs 1.8 1.7 - 7.7 K/uL   Lymphocytes Relative 55 %   Lymphs Abs 2.6 0.7 - 4.0 K/uL   Monocytes Relative 5 %   Monocytes Absolute 0.3 0.1 - 1.0 K/uL   Eosinophils Relative 2 %   Eosinophils Absolute 0.1 0.0 - 0.5 K/uL   Basophils Relative 0 %   Basophils Absolute 0.0 0.0 - 0.1 K/uL   Immature Granulocytes 0 %   Abs Immature Granulocytes 0.00 0.00 - 0.07 K/uL  Ethanol     Status: Abnormal   Collection Time: 04/04/22 10:47 PM  Result Value Ref Range   Alcohol, Ethyl (B) 307 (HH) <10 mg/dL  Magnesium     Status: None   Collection Time: 04/04/22 10:47 PM  Result Value Ref Range   Magnesium 2.2 1.7 - 2.4 mg/dL  Lipase, blood     Status: None   Collection Time: 04/04/22 10:47 PM  Result Value Ref Range   Lipase 40 11 - 51 U/L   Imaging Studies: No results found.  ED COURSE and MDM  Nursing notes, initial and subsequent vitals signs, including pulse oximetry, reviewed and interpreted by myself.  Vitals:   04/04/22 2215 04/04/22 2345  BP: 138/84 125/80  Pulse: (!) 103 100  Resp: 15 15  Temp: 98.3 F (36.8 C)   TempSrc: Oral   SpO2: 96% 98%   Medications  levETIRAcetam (KEPPRA) tablet 1,000 mg (has no administration in time range)  thiamine (VITAMIN B1) tablet 100 mg (has no administration in time range)   Patient awake and alert in no acute distress.  He is not hypertensive.  He is not tremulous.  He is showing no signs of alcohol withdrawal, and his alcohol level is 307.  We will give him a loading dose of Keppra and refill his prescription.  We will provide resources for outpatient detox.  His hypoglycemia was likely due to alcohol intoxication and lack of proper  nutrition.   PROCEDURES  Procedures   ED DIAGNOSES     ICD-10-CM   1. Hypoglycemia  E16.2     2. Noncompliance with medication regimen  Z91.148  3. Alcohol intoxication in active alcoholic with complication (HCC)  N18.335          Paula Libra, MD 04/05/22 8251

## 2022-04-05 LAB — ETHANOL: Alcohol, Ethyl (B): 307 mg/dL (ref <10)

## 2022-04-05 MED ORDER — LEVETIRACETAM 500 MG PO TABS: 500.0000 mg | ORAL_TABLET | Freq: Two times a day (BID) | ORAL | 0 refills | Status: DC

## 2022-04-06 ENCOUNTER — Telehealth: Payer: Self-pay

## 2022-04-06 NOTE — Telephone Encounter (Signed)
Transition Care Management Unsuccessful Follow-up Telephone Call  Date of discharge and from where:  04/05/22   Attempts:  1st Attempt  Reason for unsuccessful TCM follow-up call:  Left voice message  Elyse Jarvis RMA

## 2022-04-28 ENCOUNTER — Telehealth: Payer: Self-pay | Admitting: Family Medicine

## 2022-04-28 NOTE — Congregational Nurse Program (Signed)
  Dept: [phone]   Congregational Nurse Program Note  Date of Encounter: 04/28/2022  Client currently unhoused, here for community resources. Client shown the [place] pantry app and downloaded to his phone. 10 bus passes given. PCP appt will be made tomorrow on 04/29/22, currently office is closed. Client needs to be reconnected to health services. Also informed client of a CCNP nurse Hotel manager at [employer], that is there on Thursdays from 10am-4pm. Talked to [place] today about client. Will be sleeping outside a church tonight. Client waiting for phone call from [facility] per [place] request. Client stated did not feel comfortable at [facility]. Intern Sonia Baller SW at [facility] Tuesdays and Thursdays.   Past Medical History: Past Medical History:  Diagnosis Date   Alcohol abuse    Seizures ([place])     Encounter Details:  CNP Questionnaire - 04/28/22 1655       Questionnaire   Ask client: Do you give verbal consent for me to treat you today? Yes    Student Assistance [employer];Medical Student    Location Patient Served  [facility]    Visit Setting with Client Organization    Patient Status Unhoused    Insurance Medicaid    Insurance/Financial Assistance Referral Medicaid    Medication Have Medication Insecurities    Medical Provider Yes    Screening Referrals Made N/A    Medical Referrals Made [facility] PCP/Clinic    Medical Appointment Made N/A    Recently w/o PCP, now 1st time PCP visit completed due to CNs referral or appointment made [place] Insecurities    Transportation Need transportation assistance;Provided transportation assistance   provided bus passes   Housing/Utilities No permanent housing    Interpersonal Safety Do not feel safe at current residence   pt stated not comfortable at [facility]   Interventions Advocate/Support;Navigate Healthcare System;Counsel;Case Management    Abnormal to Normal Screening Since Last CN Visit N/A    Screenings CN  Performed N/A    Sent Client to Lab for: N/A    Did client attend any of the following based off CNs referral or appointments made? N/A    ED Visit Averted N/A    Life-Saving Intervention Made N/A

## 2022-04-28 NOTE — Telephone Encounter (Signed)
Patient presents to the congregational RN clinic at [facility] Requests assistance with getting re-established with primary care, neurology, and behavioral health.   He reports he has struggled recently with incarceration, homelessness, alcohol abuse, and worsening seizures.   He would like help getting re-established with his health care providers and receiving assistance with alcohol cessation.   He reports he has been taking his keppra with no missed doses. He has enough medication, just picked it up.   Attempted to call patient resource center, however they were already closed for the day at 4:45 pm. RN Gigi Gin to call in the next day or so to get appointments for patient coordinated. She will text or call patient with appointment details. He was given bus passes to help with transportation.   List to call and re-establish: PCP: NP Bo Merino at [facility] patient care center Behavioral Health: LCSW Estanislado Emms at [facility] patient care center Neurology: Dr. Alric Ran at [facility]  He also inquires about food, housing, shower, and laundry resources. He is uncomfortable at [facility] because people like to fight. Discussed food finder app and calling 211 for resources. [place].   All of the above performed in conjunction with congregational RN Gigi Gin.   Ezequiel [place], MD

## 2022-04-29 NOTE — Congregational Nurse Program (Signed)
  Dept: [phone]   Congregational Nurse Program Note  Date of Encounter: 04/29/2022  Past Medical History: Past Medical History:  Diagnosis Date   Alcohol abuse    Seizures ([place])     Encounter Details:  CNP Questionnaire - 04/29/22 1524       Questionnaire   Ask client: Do you give verbal consent for me to treat you today? Yes    Student Assistance N/A    Location Patient Served  [facility]    Visit Setting with Client Phone/Text/Email    Patient Status Unhoused    Insurance Medicaid    Insurance/Financial Assistance Referral Medicaid    Medication Have Medication Insecurities    Medical Provider Yes    Screening Referrals Made N/A    Medical Referrals Made [facility] PCP/Clinic    Medical Appointment Made N/A    Recently w/o PCP, now 1st time PCP visit completed due to CNs referral or appointment made [place] Insecurities    Transportation Need transportation assistance;Provided transportation assistance   provided bus passes   Housing/Utilities No permanent housing    Interpersonal Safety Do not feel safe at current residence   pt stated not comfortable at [facility];Counsel;Case Management    Abnormal to Normal Screening Since Last CN Visit N/A    Screenings CN Performed N/A    Sent Client to Lab for: N/A    Did client attend any of the following based off CNs referral or appointments made? N/A    ED Visit Averted N/A    Life-Saving Intervention Made N/A            Client informed of appt for 05/01/2022 at 1:20pm. Information sent to clients phone including address and the closest bus stop to clinic.

## 2022-05-01 ENCOUNTER — Ambulatory Visit: Payer: Self-pay | Admitting: Nurse Practitioner

## 2022-07-17 ENCOUNTER — Other Ambulatory Visit: Payer: Self-pay

## 2022-07-17 ENCOUNTER — Encounter (HOSPITAL_COMMUNITY): Payer: Self-pay

## 2022-07-17 ENCOUNTER — Emergency Department (HOSPITAL_COMMUNITY)
Admission: EM | Admit: 2022-07-17 | Discharge: 2022-07-17 | Disposition: A | Discharge status: Home / Self Care | Payer: Medicaid Other | Attending: Emergency Medicine | Admitting: Emergency Medicine

## 2022-07-17 DIAGNOSIS — F10920 Alcohol use, unspecified with intoxication, uncomplicated: Secondary | ICD-10-CM

## 2022-07-17 DIAGNOSIS — F10220 Alcohol dependence with intoxication, uncomplicated: Secondary | ICD-10-CM | POA: Diagnosis present

## 2022-07-17 DIAGNOSIS — Z760 Encounter for issue of repeat prescription: Secondary | ICD-10-CM | POA: Insufficient documentation

## 2022-07-17 DIAGNOSIS — Y908 Blood alcohol level of 240 mg/100 ml or more: Secondary | ICD-10-CM | POA: Insufficient documentation

## 2022-07-17 LAB — CBC WITH DIFFERENTIAL/PLATELET
Abs Immature Granulocytes: 0.01 10*3/uL (ref 0.00–0.07)
Basophils Absolute: 0 10*3/uL (ref 0.0–0.1)
Basophils Relative: 0 %
Eosinophils Absolute: 0.1 10*3/uL (ref 0.0–0.5)
Eosinophils Relative: 3 %
HCT: 42.8 % (ref 39.0–52.0)
Hemoglobin: 15.4 g/dL (ref 13.0–17.0)
Immature Granulocytes: 0 %
Lymphocytes Relative: 52 %
Lymphs Abs: 2.3 10*3/uL (ref 0.7–4.0)
MCH: 31.5 pg (ref 26.0–34.0)
MCHC: 36 g/dL (ref 30.0–36.0)
MCV: 87.5 fL (ref 80.0–100.0)
Monocytes Absolute: 0.6 10*3/uL (ref 0.1–1.0)
Monocytes Relative: 13 %
Neutro Abs: 1.4 10*3/uL — ABNORMAL LOW (ref 1.7–7.7)
Neutrophils Relative %: 32 %
Platelets: 240 10*3/uL (ref 150–400)
RBC: 4.89 MIL/uL (ref 4.22–5.81)
RDW: 12.7 % (ref 11.5–15.5)
WBC: 4.3 10*3/uL (ref 4.0–10.5)
nRBC: 0 % (ref 0.0–0.2)

## 2022-07-17 LAB — COMPREHENSIVE METABOLIC PANEL
ALT: 54 U/L — ABNORMAL HIGH (ref 0–44)
AST: 37 U/L (ref 15–41)
Albumin: 4.1 g/dL (ref 3.5–5.0)
Alkaline Phosphatase: 120 U/L (ref 38–126)
Anion gap: 12 (ref 5–15)
BUN: 5 mg/dL — ABNORMAL LOW (ref 6–20)
CO2: 26 mmol/L (ref 22–32)
Calcium: 9.2 mg/dL (ref 8.9–10.3)
Chloride: 102 mmol/L (ref 98–111)
Creatinine, Ser: 0.75 mg/dL (ref 0.61–1.24)
GFR, Estimated: >60 mL/min (ref >60)
Glucose, Bld: 99 mg/dL (ref 70–99)
Potassium: 3.7 mmol/L (ref 3.5–5.1)
Sodium: 140 mmol/L (ref 135–145)
Total Bilirubin: 0.8 mg/dL (ref 0.3–1.2)
Total Protein: 7.6 g/dL (ref 6.5–8.1)

## 2022-07-17 LAB — ETHANOL: Alcohol, Ethyl (B): 277 mg/dL — ABNORMAL HIGH (ref <10)

## 2022-07-17 MED ORDER — THIAMINE MONONITRATE 100 MG PO TABS
100.0000 mg | ORAL_TABLET | Freq: Every day | ORAL | Status: DC
  Administered 2022-07-17: 100 mg via ORAL
  Filled 2022-07-17: qty 1

## 2022-07-17 MED ORDER — ADULT MULTIVITAMIN W/MINERALS CH
1.0000 | ORAL_TABLET | Freq: Every day | ORAL | Status: DC
  Administered 2022-07-17: 1 via ORAL
  Filled 2022-07-17: qty 1

## 2022-07-17 MED ORDER — THIAMINE HCL 100 MG/ML IJ SOLN: 100.0000 mg | Freq: Every day | INTRAMUSCULAR | Status: DC

## 2022-07-17 MED ORDER — FOLIC ACID 1 MG PO TABS
1.0000 mg | ORAL_TABLET | Freq: Every day | ORAL | Status: DC
  Administered 2022-07-17: 1 mg via ORAL
  Filled 2022-07-17: qty 1

## 2022-07-17 MED ORDER — LORAZEPAM 1 MG PO TABS: 1.0000 mg | ORAL_TABLET | ORAL | Status: DC | PRN

## 2022-07-17 MED ORDER — LEVETIRACETAM 500 MG PO TABS
500.0000 mg | ORAL_TABLET | Freq: Two times a day (BID) | ORAL | 2 refills | Status: DC
  Filled 2022-07-17: qty 60, 30d supply, fill #0

## 2022-07-17 MED ORDER — LEVETIRACETAM IN NACL 1500 MG/100ML IV SOLN
1500.0000 mg | Freq: Once | INTRAVENOUS | Status: AC
  Administered 2022-07-17: 1500 mg via INTRAVENOUS
  Filled 2022-07-17: qty 100

## 2022-07-17 MED ORDER — LEVETIRACETAM 500 MG PO TABS: 500.0000 mg | ORAL_TABLET | Freq: Two times a day (BID) | ORAL | 2 refills | Status: DC

## 2022-07-17 MED ORDER — LORAZEPAM 1 MG PO TABS: 0.5000 mg | ORAL_TABLET | ORAL | Status: DC | PRN

## 2022-07-17 NOTE — ED Provider Notes (Signed)
Signout received on this 22 year old male.  At the time of signout he is waiting to metabolize.  Patient was initially concerned that he may have a seizure coming on.  He was Keppra loaded.  Did not have a seizure.  Was placed on CIWA protocol.  Currently intoxicated and awaiting metabolization.  Physical Exam  BP 108/60   Pulse 89   Temp 98.6 F (37 C) (Oral)   Resp 15   SpO2 98%   Physical Exam  Procedures  Procedures  ED Course / MDM    Medical Decision Making Amount and/or Complexity of Data Reviewed Labs: ordered.  Risk OTC drugs. Prescription drug management.   Patient improved.  Alert and oriented.  Tolerating p.o. intake.  Appropriate for discharge.       Marita [place], PA-C 07/17/22 1034    Alvira Monday, MD 07/19/22 0730

## 2022-07-17 NOTE — ED Notes (Signed)
Pt will not leave, security called to bedside.

## 2022-07-17 NOTE — Progress Notes (Signed)
Shelter and substance abuse resources added to patients AVS.

## 2022-07-17 NOTE — Discharge Instructions (Addendum)
Please follow up with your PCP. I have listed one for you. Please take your medications as prescribed.   Contact a doctor if: You have another seizure or seizures. Call the doctor each time you have a seizure. The pattern of your seizures changes. You keep having seizures with treatment. You have symptoms of being sick or having an infection. You are not able to take your medicine. Get help right away if: You have any of these problems: A seizure that lasts longer than 5 minutes. Many seizures in a row and you do not feel better between seizures. A seizure that makes it harder to breathe. A seizure and you can no longer speak or use part of your body. You do not wake up right after a seizure. You get hurt during a seizure. You feel confused or have pain right after a seizure. These symptoms may be an emergency. Get help right away. Call your local emergency services (911 in the [place] Do not wait to see if the symptoms will go away. Do not drive yourself to the hospital.

## 2022-07-17 NOTE — ED Triage Notes (Signed)
Pt comes via [facility], states that he has been out of his Keppra for a month and feel like he may have a seizure tonight, pt has ETOH on board, drank 6 beers today.

## 2022-07-17 NOTE — ED Provider Notes (Signed)
EMERGENCY DEPARTMENT AT [facility] Provider Note   CSN: [id number] Arrival date & time: 07/17/22  0205     History Chief Complaint  Patient presents with   Medication Refill    Sean [name] is a 22 y.o. male with history of seizures and alcohol use disorder, currently experiencing homelessness presents the emergency room today for evaluation of being out of his Keppra medication.  He reports that he has been out of his Medication for a month and he "felt like he was going to have a seizure tonight", but did not have a seizure.  He reports that he did have 6 beers today but does not know when.  When asking him how much he drinks he shrugged his shoulders.  He does not report any recent seizures.  He reports that he does not have anywhere to go and needed somewhere to lay down.   Medication Refill      Home Medications Prior to Admission medications   Medication Sig Start Date End Date Taking? Authorizing Provider  levETIRAcetam (KEPPRA) 500 MG tablet Take 1 tablet (500 mg total) by mouth 2 (two) times daily. 04/05/22 05/05/22  Molpus, Jonny Ruiz, MD      Allergies    Patient has no known allergies.    Review of Systems   Review of Systems  Constitutional:  Negative for chills and fever.  Eyes:  Negative for photophobia and visual disturbance.  Respiratory:  Negative for shortness of breath.   Cardiovascular:  Negative for chest pain.  Neurological:  Negative for seizures and headaches.    Physical Exam Updated Vital Signs BP 123/75 (BP Location: Right Arm)   Pulse 76   Temp 98.5 F (36.9 C) (Oral)   Resp 16   SpO2 100%  Physical Exam Vitals and nursing note reviewed.  Constitutional:      Appearance: He is not toxic-appearing.     Comments: Slightly slurred speech but smells of alcohol  HENT:     Mouth/Throat:     Mouth: Mucous membranes are moist.  Eyes:     Extraocular Movements: Extraocular movements intact.     Pupils: Pupils are equal, round,  and reactive to light.  Cardiovascular:     Rate and Rhythm: Normal rate.  Pulmonary:     Effort: Pulmonary effort is normal. No respiratory distress.  Musculoskeletal:     Cervical back: Normal range of motion. No rigidity.  Skin:    General: Skin is warm and dry.  Neurological:     General: No focal deficit present.     Mental Status: He is alert.     Comments: No tremulous behavior. Moving all extremities. Oriented x3.      ED Results / Procedures / Treatments   Labs (all labs ordered are listed, but only abnormal results are displayed) Labs Reviewed  ETHANOL - Abnormal; Notable for the following components:      Result Value   Alcohol, Ethyl (B) 277 (*)    All other components within normal limits  CBC WITH DIFFERENTIAL/PLATELET - Abnormal; Notable for the following components:   Neutro Abs 1.4 (*)    All other components within normal limits  COMPREHENSIVE METABOLIC PANEL - Abnormal; Notable for the following components:   BUN 5 (*)    ALT 54 (*)    All other components within normal limits  URINALYSIS, ROUTINE W REFLEX MICROSCOPIC  LEVETIRACETAM LEVEL  RAPID URINE DRUG SCREEN, HOSP PERFORMED    EKG None  Radiology  No results found.  Procedures Procedures   Medications Ordered in ED Medications  LORazepam (ATIVAN) tablet 1-4 mg (has no administration in time range)    Or  LORazepam (ATIVAN) tablet 0.5 mg (has no administration in time range)  thiamine (VITAMIN B1) tablet 100 mg (has no administration in time range)    Or  thiamine (VITAMIN B1) injection 100 mg (has no administration in time range)  folic acid (FOLVITE) tablet 1 mg (has no administration in time range)  multivitamin with minerals tablet 1 tablet (has no administration in time range)  levETIRAcetam (KEPPRA) IVPB 1500 mg/ 100 mL premix (0 mg Intravenous Stopped 07/17/22 0404)    ED Course/ Medical Decision Making/ A&P                           Medical Decision Making Amount and/or  Complexity of Data Reviewed Labs: ordered.  Risk OTC drugs. Prescription drug management.   22 y.o. male presents to the ER for evaluation of medication refill and alcohol intoxication. Differential diagnosis includes but is not limited to alcohol intoxication, electrolyte abnormality, seizure disorder. Vital signs unremarkable. Physical exam as noted above.   Keppra loading dose of  ordered as well as CIWA protocol with thiamine, multivitamin, and folic acid.  I independently reviewed and interpreted the patient's labs.  CBC shows slightly decreased neutrophil count however no anemia or leukocytosis.  CMP shows BUN at 5 with the increased ALT at 54 however no other electrolyte or LFT abnormality.  Ethanol is at 277.  Keppra level still pending.  UDS and urinalysis still needs to be collected.  On reevaluation, patient has taken out his IV but is resting comfortably on the stretcher in no acute distress.  Equal chest rise.  7:08 AM Care of Sean [name] transferred to PA Marita Kansas at the end of my shift as the patient will require reassessment once labs/imaging have resulted. Patient presentation, ED course, and plan of care discussed with review of all pertinent labs and imaging. Please see his/her note for further details regarding further ED course and disposition. Plan at time of handoff is re-assess the patient once he has metabolized more of his alcohool and can be discharged home. This may be altered or completely changed at the discretion of the oncoming team pending results of further workup.  Portions of this report may have been transcribed using voice recognition software. Every effort was made to ensure accuracy; however, inadvertent computerized transcription errors may be present.   Final Clinical Impression(s) / ED Diagnoses Final diagnoses:  Medication refill  Alcoholic intoxication without complication    Rx / DC Orders ED Discharge Orders     None          Achille Rich, PA-C 07/17/22 1610    Gilda Crease, MD 07/17/22 [phone]

## 2022-07-20 ENCOUNTER — Telehealth: Payer: Self-pay

## 2022-07-20 LAB — LEVETIRACETAM LEVEL: Levetiracetam Lvl: <2 ug/mL — ABNORMAL LOW (ref 10.0–40.0)

## 2022-07-20 NOTE — Transitions of Care (Post Inpatient/ED Visit) (Cosign Needed)
   07/20/2022  Name: Sean [name] MRN: [id number] DOB: 02-25-2001  Today's TOC FU Call Status: Today's TOC FU Call Status:: Unsuccessul Call (1st Attempt) Unsuccessful Call (1st Attempt) Date: 07/20/22  Attempted to reach the patient regarding the most recent Inpatient/ED visit.  Follow Up Plan: Additional outreach attempts will be made to reach the patient to complete the Transitions of Care (Post Inpatient/ED visit) call.   Signature Renelda Loma RMA

## 2022-07-21 ENCOUNTER — Telehealth: Payer: Self-pay

## 2022-07-21 NOTE — Transitions of Care (Post Inpatient/ED Visit) (Cosign Needed)
   07/21/2022  Name: Sean [name] MRN: [id number] DOB: December 10, 2000  Today's TOC FU Call Status: Today's TOC FU Call Status:: Unsuccessful Call (2nd Attempt) Unsuccessful Call (2nd Attempt) Date: 07/21/22  Attempted to reach the patient regarding the most recent Inpatient/ED visit.  Follow Up Plan: Additional outreach attempts will be made to reach the patient to complete the Transitions of Care (Post Inpatient/ED visit) call.   Signature Renelda Loma RMA

## 2022-07-22 ENCOUNTER — Telehealth: Payer: Self-pay

## 2022-07-22 NOTE — Transitions of Care (Post Inpatient/ED Visit) (Cosign Needed)
   07/22/2022  Name: Sean [name] MRN: [id number] DOB: Sep 01, 2000  Today's TOC FU Call Status: Today's TOC FU Call Status:: Unsuccessful Call (3rd Attempt) Unsuccessful Call (3rd Attempt) Date: 07/22/22  Attempted to reach the patient regarding the most recent Inpatient/ED visit.  Follow Up Plan: No further outreach attempts will be made at this time. We have been unable to contact the patient.  Signature Renelda Loma RMA

## 2022-10-09 ENCOUNTER — Other Ambulatory Visit: Payer: Self-pay

## 2022-10-09 ENCOUNTER — Emergency Department (HOSPITAL_COMMUNITY): Payer: MEDICAID

## 2022-10-09 ENCOUNTER — Emergency Department (HOSPITAL_COMMUNITY)
Admission: EM | Admit: 2022-10-09 | Discharge: 2022-10-09 | Disposition: A | Discharge status: Home / Self Care | Payer: MEDICAID | Attending: Emergency Medicine | Admitting: Emergency Medicine

## 2022-10-09 DIAGNOSIS — S0181XA Laceration without foreign body of other part of head, initial encounter: Secondary | ICD-10-CM

## 2022-10-09 DIAGNOSIS — Z760 Encounter for issue of repeat prescription: Secondary | ICD-10-CM | POA: Insufficient documentation

## 2022-10-09 DIAGNOSIS — S01112A Laceration without foreign body of left eyelid and periocular area, initial encounter: Secondary | ICD-10-CM | POA: Diagnosis present

## 2022-10-09 DIAGNOSIS — Z23 Encounter for immunization: Secondary | ICD-10-CM | POA: Diagnosis not present

## 2022-10-09 LAB — COMPREHENSIVE METABOLIC PANEL
ALT: 42 U/L (ref 0–44)
AST: 50 U/L — ABNORMAL HIGH (ref 15–41)
Albumin: 4.4 g/dL (ref 3.5–5.0)
Alkaline Phosphatase: 86 U/L (ref 38–126)
Anion gap: 12 (ref 5–15)
BUN: 5 mg/dL — ABNORMAL LOW (ref 6–20)
CO2: 23 mmol/L (ref 22–32)
Calcium: 8.9 mg/dL (ref 8.9–10.3)
Chloride: 103 mmol/L (ref 98–111)
Creatinine, Ser: 0.69 mg/dL (ref 0.61–1.24)
GFR, Estimated: >60 mL/min (ref >60)
Glucose, Bld: 89 mg/dL (ref 70–99)
Potassium: 3.5 mmol/L (ref 3.5–5.1)
Sodium: 138 mmol/L (ref 135–145)
Total Bilirubin: 0.7 mg/dL (ref 0.3–1.2)
Total Protein: 8.3 g/dL — ABNORMAL HIGH (ref 6.5–8.1)

## 2022-10-09 LAB — CBC WITH DIFFERENTIAL/PLATELET
Abs Immature Granulocytes: 0.02 10*3/uL (ref 0.00–0.07)
Basophils Absolute: 0 10*3/uL (ref 0.0–0.1)
Basophils Relative: 1 %
Eosinophils Absolute: 0.1 10*3/uL (ref 0.0–0.5)
Eosinophils Relative: 3 %
HCT: 41.1 % (ref 39.0–52.0)
Hemoglobin: 14.8 g/dL (ref 13.0–17.0)
Immature Granulocytes: 1 %
Lymphocytes Relative: 48 %
Lymphs Abs: 1.6 10*3/uL (ref 0.7–4.0)
MCH: 31.8 pg (ref 26.0–34.0)
MCHC: 36 g/dL (ref 30.0–36.0)
MCV: 88.4 fL (ref 80.0–100.0)
Monocytes Absolute: 0.2 10*3/uL (ref 0.1–1.0)
Monocytes Relative: 6 %
Neutro Abs: 1.3 10*3/uL — ABNORMAL LOW (ref 1.7–7.7)
Neutrophils Relative %: 41 %
Platelets: 177 10*3/uL (ref 150–400)
RBC: 4.65 MIL/uL (ref 4.22–5.81)
RDW: 13.2 % (ref 11.5–15.5)
WBC: 3.3 10*3/uL — ABNORMAL LOW (ref 4.0–10.5)
nRBC: 0 % (ref 0.0–0.2)

## 2022-10-09 MED ORDER — LIDOCAINE HCL 2 % IJ SOLN
10.0000 mL | Freq: Once | INTRAMUSCULAR | Status: AC
  Administered 2022-10-09: 200 mg
  Filled 2022-10-09: qty 20

## 2022-10-09 MED ORDER — TETANUS-DIPHTH-ACELL PERTUSSIS 5-2.5-18.5 LF-MCG/0.5 IM SUSY
0.5000 mL | PREFILLED_SYRINGE | Freq: Once | INTRAMUSCULAR | Status: AC
  Administered 2022-10-09: 0.5 mL via INTRAMUSCULAR
  Filled 2022-10-09: qty 0.5

## 2022-10-09 MED ORDER — LEVETIRACETAM 500 MG PO TABS: 500.0000 mg | ORAL_TABLET | Freq: Two times a day (BID) | ORAL | 0 refills | Status: AC

## 2022-10-09 MED ORDER — LIDOCAINE-EPINEPHRINE-TETRACAINE (LET) TOPICAL GEL
3.0000 mL | Freq: Once | TOPICAL | Status: AC
  Administered 2022-10-09: 3 mL via TOPICAL
  Filled 2022-10-09: qty 3
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
## 2022-10-12 ENCOUNTER — Telehealth: Payer: Self-pay

## 2022-10-12 NOTE — Transitions of Care (Post Inpatient/ED Visit) (Signed)
   10/12/2022  Name: Sean [name] MRN: [id number] DOB: [birth year]  Today's TOC FU Call Status: Today's TOC FU Call Status:: Unsuccessul Call (1st Attempt) Unsuccessful Call (1st Attempt) Date: 10/12/22  Attempted to reach the patient regarding the most recent Inpatient/ED visit.  Follow Up Plan: Additional outreach attempts will be made to reach the patient to complete the Transitions of Care (Post Inpatient/ED visit) call.   Signature Renelda Loma RMA

## 2022-10-13 ENCOUNTER — Telehealth: Payer: Self-pay

## 2022-10-13 NOTE — Transitions of Care (Post Inpatient/ED Visit) (Cosign Needed)
   10/13/2022  Name: [name] MRN: [id number] DOB: 2001-03-13  Today's TOC FU Call Status: Today's TOC FU Call Status:: Unsuccessful Call (2nd Attempt) Unsuccessful Call (2nd Attempt) Date: 10/13/22  Attempted to reach the patient regarding the most recent Inpatient/ED visit.  Follow Up Plan: No further outreach attempts will be made at this time. We have been unable to contact the patient. Phone number is disconnected. Signature Renelda Loma RMA

## 2022-11-18 ENCOUNTER — Encounter (HOSPITAL_COMMUNITY): Payer: Self-pay | Admitting: Emergency Medicine

## 2022-11-18 ENCOUNTER — Emergency Department (HOSPITAL_COMMUNITY)
Admission: EM | Admit: 2022-11-18 | Discharge: 2022-11-18 | Discharge status: Against Medical Advice | Payer: MEDICAID | Attending: Emergency Medicine | Admitting: Emergency Medicine

## 2022-11-18 ENCOUNTER — Other Ambulatory Visit: Payer: Self-pay

## 2022-11-18 DIAGNOSIS — R569 Unspecified convulsions: Secondary | ICD-10-CM | POA: Insufficient documentation

## 2022-11-18 DIAGNOSIS — R519 Headache, unspecified: Secondary | ICD-10-CM | POA: Diagnosis not present

## 2022-11-18 DIAGNOSIS — Z5321 Procedure and treatment not carried out due to patient leaving prior to being seen by health care provider: Secondary | ICD-10-CM | POA: Insufficient documentation

## 2022-11-18 LAB — CBC WITH DIFFERENTIAL/PLATELET
Abs Immature Granulocytes: 0.02 10*3/uL (ref 0.00–0.07)
Basophils Absolute: 0 10*3/uL (ref 0.0–0.1)
Basophils Relative: 1 %
Eosinophils Absolute: 0.1 10*3/uL (ref 0.0–0.5)
Eosinophils Relative: 1 %
HCT: 40.8 % (ref 39.0–52.0)
Hemoglobin: 14.7 g/dL (ref 13.0–17.0)
Immature Granulocytes: 0 %
Lymphocytes Relative: 43 %
Lymphs Abs: 2.3 10*3/uL (ref 0.7–4.0)
MCH: 31.6 pg (ref 26.0–34.0)
MCHC: 36 g/dL (ref 30.0–36.0)
MCV: 87.7 fL (ref 80.0–100.0)
Monocytes Absolute: 0.3 10*3/uL (ref 0.1–1.0)
Monocytes Relative: 6 %
Neutro Abs: 2.6 10*3/uL (ref 1.7–7.7)
Neutrophils Relative %: 49 %
Platelets: 238 10*3/uL (ref 150–400)
RBC: 4.65 MIL/uL (ref 4.22–5.81)
RDW: 12.8 % (ref 11.5–15.5)
WBC: 5.4 10*3/uL (ref 4.0–10.5)
nRBC: 0 % (ref 0.0–0.2)

## 2022-11-18 LAB — COMPREHENSIVE METABOLIC PANEL
ALT: 25 U/L (ref 0–44)
AST: 40 U/L (ref 15–41)
Albumin: 4.6 g/dL (ref 3.5–5.0)
Alkaline Phosphatase: 108 U/L (ref 38–126)
Anion gap: 17 — ABNORMAL HIGH (ref 5–15)
BUN: 5 mg/dL — ABNORMAL LOW (ref 6–20)
CO2: 22 mmol/L (ref 22–32)
Calcium: 9.2 mg/dL (ref 8.9–10.3)
Chloride: 98 mmol/L (ref 98–111)
Creatinine, Ser: 0.6 mg/dL — ABNORMAL LOW (ref 0.61–1.24)
GFR, Estimated: >60 mL/min (ref >60)
Glucose, Bld: 73 mg/dL (ref 70–99)
Potassium: 3.6 mmol/L (ref 3.5–5.1)
Sodium: 137 mmol/L (ref 135–145)
Total Bilirubin: 0.9 mg/dL (ref 0.3–1.2)
Total Protein: 8.5 g/dL — ABNORMAL HIGH (ref 6.5–8.1)

## 2022-11-18 LAB — ETHANOL: Alcohol, Ethyl (B): 387 mg/dL (ref <10)

## 2022-11-18 NOTE — ED Triage Notes (Signed)
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 Staff there called ems stating patient needs medical records before joining.  Using medical interpreter- patient reports having problems with his seizures for a long time. States he takes keppra and has not had it in 2 months. C/o headache today.

## 2022-11-19 ENCOUNTER — Telehealth: Payer: Self-pay

## 2022-11-19 LAB — LEVETIRACETAM LEVEL: Levetiracetam Lvl: <2 ug/mL — ABNORMAL LOW (ref 10.0–40.0)

## 2022-11-19 NOTE — Transitions of Care (Post Inpatient/ED Visit) (Cosign Needed)
   11/19/2022  Name: Sean [name] MRN: [id number] DOB: 2000/07/06  Today's TOC FU Call Status: Today's TOC FU Call Status:: Unsuccessful Call (1st Attempt) Unsuccessful Call (1st Attempt) Date: 11/19/22  Attempted to reach the patient regarding the most recent Inpatient/ED visit.  Follow Up Plan: No further outreach attempts will be made at this time. We have been unable to contact the patient.  Signature Renelda Loma RMA

## 2022-12-14 ENCOUNTER — Emergency Department (HOSPITAL_COMMUNITY): Payer: MEDICAID

## 2022-12-14 ENCOUNTER — Emergency Department (HOSPITAL_COMMUNITY)
Admission: EM | Admit: 2022-12-14 | Discharge: 2022-12-14 | Disposition: A | Discharge status: Home / Self Care | Payer: MEDICAID | Attending: Emergency Medicine | Admitting: Emergency Medicine

## 2022-12-14 ENCOUNTER — Encounter (HOSPITAL_COMMUNITY): Payer: Self-pay | Admitting: *Deleted

## 2022-12-14 ENCOUNTER — Other Ambulatory Visit: Payer: Self-pay

## 2022-12-14 DIAGNOSIS — F10920 Alcohol use, unspecified with intoxication, uncomplicated: Secondary | ICD-10-CM

## 2022-12-14 DIAGNOSIS — E876 Hypokalemia: Secondary | ICD-10-CM

## 2022-12-14 DIAGNOSIS — Z760 Encounter for issue of repeat prescription: Secondary | ICD-10-CM

## 2022-12-14 DIAGNOSIS — R464 Slowness and poor responsiveness: Secondary | ICD-10-CM | POA: Diagnosis not present

## 2022-12-14 DIAGNOSIS — Y908 Blood alcohol level of 240 mg/100 ml or more: Secondary | ICD-10-CM | POA: Diagnosis not present

## 2022-12-14 DIAGNOSIS — F10220 Alcohol dependence with intoxication, uncomplicated: Secondary | ICD-10-CM | POA: Insufficient documentation

## 2022-12-14 DIAGNOSIS — R4182 Altered mental status, unspecified: Secondary | ICD-10-CM | POA: Insufficient documentation

## 2022-12-14 LAB — COMPREHENSIVE METABOLIC PANEL
ALT: 33 U/L (ref 0–44)
AST: 50 U/L — ABNORMAL HIGH (ref 15–41)
Albumin: 4.7 g/dL (ref 3.5–5.0)
Alkaline Phosphatase: 150 U/L — ABNORMAL HIGH (ref 38–126)
Anion gap: 13 (ref 5–15)
BUN: 8 mg/dL (ref 6–20)
CO2: 25 mmol/L (ref 22–32)
Calcium: 8.8 mg/dL — ABNORMAL LOW (ref 8.9–10.3)
Chloride: 105 mmol/L (ref 98–111)
Creatinine, Ser: 0.66 mg/dL (ref 0.61–1.24)
GFR, Estimated: >60 mL/min (ref >60)
Glucose, Bld: 99 mg/dL (ref 70–99)
Potassium: 2.8 mmol/L — ABNORMAL LOW (ref 3.5–5.1)
Sodium: 143 mmol/L (ref 135–145)
Total Bilirubin: 0.7 mg/dL (ref 0.3–1.2)
Total Protein: 8.5 g/dL — ABNORMAL HIGH (ref 6.5–8.1)

## 2022-12-14 LAB — RAPID URINE DRUG SCREEN, HOSP PERFORMED
Amphetamines: NOT DETECTED
Barbiturates: NOT DETECTED
Benzodiazepines: NOT DETECTED
Cocaine: NOT DETECTED
Opiates: NOT DETECTED
Tetrahydrocannabinol: NOT DETECTED

## 2022-12-14 LAB — CBC
HCT: 42.8 % (ref 39.0–52.0)
Hemoglobin: 15 g/dL (ref 13.0–17.0)
MCH: 32 pg (ref 26.0–34.0)
MCHC: 35 g/dL (ref 30.0–36.0)
MCV: 91.3 fL (ref 80.0–100.0)
Platelets: 161 10*3/uL (ref 150–400)
RBC: 4.69 MIL/uL (ref 4.22–5.81)
RDW: 13 % (ref 11.5–15.5)
WBC: 4.4 10*3/uL (ref 4.0–10.5)
nRBC: 0 % (ref 0.0–0.2)

## 2022-12-14 LAB — ETHANOL: Alcohol, Ethyl (B): 456 mg/dL (ref <10)

## 2022-12-14 LAB — CK: Total CK: 186 U/L (ref 49–397)

## 2022-12-14 LAB — SALICYLATE LEVEL: Salicylate Lvl: <7 mg/dL — ABNORMAL LOW (ref 7.0–30.0)

## 2022-12-14 LAB — ACETAMINOPHEN LEVEL: Acetaminophen (Tylenol), Serum: <10 ug/mL — ABNORMAL LOW (ref 10–30)

## 2022-12-14 MED ORDER — LEVETIRACETAM 500 MG PO TABS
500.0000 mg | ORAL_TABLET | Freq: Two times a day (BID) | ORAL | 2 refills | Status: AC
Start: 2022-12-14 — End: ?
  Filled 2022-12-14: qty 30, 15d supply, fill #0

## 2022-12-14 MED ORDER — POTASSIUM CHLORIDE 10 MEQ/100ML IV SOLN
10.0000 meq | Freq: Once | INTRAVENOUS | Status: AC
  Administered 2022-12-14: 10 meq via INTRAVENOUS
  Filled 2022-12-14: qty 100

## 2022-12-14 MED ORDER — POTASSIUM CHLORIDE CRYS ER 20 MEQ PO TBCR
40.0000 meq | EXTENDED_RELEASE_TABLET | Freq: Once | ORAL | Status: AC
  Administered 2022-12-14: 40 meq via ORAL
  Filled 2022-12-14: qty 2

## 2022-12-14 NOTE — Discharge Instructions (Addendum)
Your workup in the ER today was reassuring for acute findings.  It does appear that you are significantly intoxicated resulting in your presentation today.  I recommend avoiding drinking alcohol in excess to prevent reoccurrence of this episode again.  Your potassium was a little low which could be due to your alcohol intake, and it could also be dietary.  I recommend consuming foods that are high in potassium to help replete this.  I recommend following up with your primary doctor in 1 week to have this lab rechecked.  If you do not have a primary doctor I have given you a referral to the wellness center with a number to call to schedule appointment for follow-up.  Additionally, I have refilled your Keppra.  Is very important that you take this medication as prescribed every day to help prevent seizures.  I have also given you some resources to help with your substance use.   Return if development of any new or worsening symptoms.
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
## 2022-12-14 NOTE — ED Notes (Signed)
Pt awake and tolerating food and drinks
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
## 2022-12-15 ENCOUNTER — Other Ambulatory Visit (HOSPITAL_COMMUNITY): Payer: Self-pay

## 2022-12-15 ENCOUNTER — Other Ambulatory Visit: Payer: Self-pay

## 2022-12-15 ENCOUNTER — Encounter (HOSPITAL_COMMUNITY): Payer: Self-pay | Admitting: Emergency Medicine

## 2022-12-28 ENCOUNTER — Other Ambulatory Visit (HOSPITAL_COMMUNITY): Payer: Self-pay
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
